# Patient Record
Sex: Male | Born: 1965 | Race: White | Hispanic: No | Marital: Married | Smoking: Never smoker
Health system: Southern US, Community
[De-identification: ages and names within clinical notes are randomized; demographics above are authoritative.]

## PROBLEM LIST (undated history)

## (undated) DIAGNOSIS — G4730 Sleep apnea, unspecified: Secondary | ICD-10-CM

## (undated) DIAGNOSIS — I639 Cerebral infarction, unspecified: Secondary | ICD-10-CM

## (undated) DIAGNOSIS — I10 Essential (primary) hypertension: Secondary | ICD-10-CM

## (undated) DIAGNOSIS — E041 Nontoxic single thyroid nodule: Secondary | ICD-10-CM

## (undated) DIAGNOSIS — H269 Unspecified cataract: Secondary | ICD-10-CM

## (undated) DIAGNOSIS — E119 Type 2 diabetes mellitus without complications: Secondary | ICD-10-CM

## (undated) DIAGNOSIS — D352 Benign neoplasm of pituitary gland: Secondary | ICD-10-CM

## (undated) DIAGNOSIS — N200 Calculus of kidney: Secondary | ICD-10-CM

## (undated) HISTORY — DX: Benign neoplasm of pituitary gland: D35.2

## (undated) HISTORY — PX: OTHER SURGICAL HISTORY: SHX169

## (undated) HISTORY — DX: Essential (primary) hypertension: I10

## (undated) HISTORY — DX: Cerebral infarction, unspecified: I63.9

## (undated) HISTORY — DX: Unspecified cataract: H26.9

## (undated) HISTORY — DX: Sleep apnea, unspecified: G47.30

## (undated) HISTORY — DX: Nontoxic single thyroid nodule: E04.1

## (undated) HISTORY — PX: HERNIA REPAIR: SHX51

## (undated) HISTORY — DX: Calculus of kidney: N20.0

## (undated) HISTORY — DX: Type 2 diabetes mellitus without complications: E11.9

---

## 2017-02-28 ENCOUNTER — Encounter: Payer: Self-pay | Admitting: Gastroenterology

## 2017-04-11 ENCOUNTER — Ambulatory Visit (AMBULATORY_SURGERY_CENTER): Payer: Self-pay | Admitting: *Deleted

## 2017-04-11 VITALS — Ht 68.0 in | Wt 197.4 lb

## 2017-04-11 DIAGNOSIS — Z1211 Encounter for screening for malignant neoplasm of colon: Secondary | ICD-10-CM

## 2017-04-11 MED ORDER — NA SULFATE-K SULFATE-MG SULF 17.5-3.13-1.6 GM/177ML PO SOLN: 1.0000 [IU] | Freq: Once | ORAL | 0 refills | Status: AC

## 2017-04-11 NOTE — Progress Notes (Signed)
No egg or soy allergy known to patient  No issues with past sedation with any surgeries  or procedures, no intubation problems  No diet pills per patient No home 02 use per patient  No blood thinners per patient  Pt denies issues with constipation  No A fib or A flutter  EMMI video sent to pt's e mail  

## 2017-04-18 ENCOUNTER — Encounter: Payer: Self-pay | Admitting: Gastroenterology

## 2017-04-25 ENCOUNTER — Ambulatory Visit (AMBULATORY_SURGERY_CENTER): Payer: BLUE CROSS/BLUE SHIELD | Admitting: Gastroenterology

## 2017-04-25 ENCOUNTER — Encounter: Payer: Self-pay | Admitting: Gastroenterology

## 2017-04-25 VITALS — BP 125/76 | HR 66 | Temp 98.0°F | Resp 18 | Ht 68.0 in | Wt 197.0 lb

## 2017-04-25 DIAGNOSIS — Z1211 Encounter for screening for malignant neoplasm of colon: Secondary | ICD-10-CM

## 2017-04-25 DIAGNOSIS — Z1212 Encounter for screening for malignant neoplasm of rectum: Secondary | ICD-10-CM | POA: Diagnosis not present

## 2017-04-25 MED ORDER — SODIUM CHLORIDE 0.9 % IV SOLN: 500.0000 mL | INTRAVENOUS | Status: DC

## 2017-04-25 NOTE — Progress Notes (Signed)
Pt's states no medical or surgical changes since previsit or office visit. 

## 2017-04-25 NOTE — Patient Instructions (Signed)
YOU HAD AN ENDOSCOPIC PROCEDURE TODAY AT THE [facility]:   Refer to the procedure report that was given to you for any specific questions about what was found during the examination.  If the procedure report does not answer your questions, please call your gastroenterologist to clarify.  If you requested that your care partner not be given the details of your procedure findings, then the procedure report has been included in a sealed envelope for you to review at your convenience later.  YOU SHOULD EXPECT: Some feelings of bloating in the abdomen. Passage of more gas than usual.  Walking can help get rid of the air that was put into your GI tract during the procedure and reduce the bloating. If you had a lower endoscopy (such as a colonoscopy or flexible sigmoidoscopy) you may notice spotting of blood in your stool or on the toilet paper. If you underwent a bowel prep for your procedure, you may not have a normal bowel movement for a few days.  Please Note:  You might notice some irritation and congestion in your nose or some drainage.  This is from the oxygen used during your procedure.  There is no need for concern and it should clear up in a day or so.  SYMPTOMS TO REPORT IMMEDIATELY:   Following lower endoscopy (colonoscopy or flexible sigmoidoscopy):  Excessive amounts of blood in the stool  Significant tenderness or worsening of abdominal pains  Swelling of the abdomen that is new, acute  Fever of 100F or higher   For urgent or emergent issues, a gastroenterologist can be reached at any hour by calling [phone].   DIET:  We do recommend a small meal at first, but then you may proceed to your regular diet.  Drink plenty of fluids but you should avoid alcoholic beverages for 24 hours.  ACTIVITY:  You should plan to take it easy for the rest of today and you should NOT DRIVE or use heavy machinery until tomorrow (because of the sedation medicines used during the test).     FOLLOW UP: Our staff will call the number listed on your records the next business day following your procedure to check on you and address any questions or concerns that you may have regarding the information given to you following your procedure. If we do not reach you, we will leave a message.  However, if you are feeling well and you are not experiencing any problems, there is no need to return our call.  We will assume that you have returned to your regular daily activities without incident.  If any biopsies were taken you will be contacted by phone or by letter within the next 1-3 weeks.  Please call us at [phone] if you have not heard about the biopsies in 3 weeks.    SIGNATURES/CONFIDENTIALITY: You and/or your care partner have signed paperwork which will be entered into your electronic medical record.  These signatures attest to the fact that that the information above on your After Visit Summary has been reviewed and is understood.  Full responsibility of the confidentiality of this discharge information lies with you and/or your care-partner.  Thank you for letting us take care of your healthcare needs today. 

## 2017-04-25 NOTE — Progress Notes (Signed)
Report to PACU, RN, vss, BBS= Clear.  

## 2017-04-25 NOTE — Op Note (Signed)
[place] Patient Name: Jaime Thompson Procedure Date: 04/25/2017 10:05 AM MRN: [id number] Endoscopist: Ladene Artist , MD Age: 51 Referring MD:  Date of Birth: [birth year] Gender: Male Account #: [id number] Procedure:                Colonoscopy Indications:              Screening for colorectal malignant neoplasm Medicines:                Monitored Anesthesia Care Procedure:                Pre-Anesthesia Assessment:                           - Prior to the procedure, a History and Physical                            was performed, and patient medications and                            allergies were reviewed. The patient's tolerance of                            previous anesthesia was also reviewed. The risks                            and benefits of the procedure and the sedation                            options and risks were discussed with the patient.                            All questions were answered, and informed consent                            was obtained. Prior Anticoagulants: The patient has                            taken no previous anticoagulant or antiplatelet                            agents. ASA Grade Assessment: II - A patient with                            mild systemic disease. After reviewing the risks                            and benefits, the patient was deemed in                            satisfactory condition to undergo the procedure.                           After obtaining informed consent, the colonoscope  was passed under direct vision. Throughout the                            procedure, the patient's blood pressure, pulse, and                            oxygen saturations were monitored continuously. The                            Model PCF-[id number]) scope was introduced                            through the anus and advanced to the the cecum,                            identified by  appendiceal orifice and ileocecal                            valve. The ileocecal valve, appendiceal orifice,                            and rectum were photographed. The quality of the                            bowel preparation was excellent. The colonoscopy                            was performed without difficulty. The patient                            tolerated the procedure well. Scope In: 10:19:53 AM Scope Out: 10:31:33 AM Scope Withdrawal Time: 0 hours 10 minutes 40 seconds  Total Procedure Duration: 0 hours 11 minutes 40 seconds  Findings:                 The perianal and digital rectal examinations were                            normal.                           Internal hemorrhoids were found during                            retroflexion. The hemorrhoids were small and Grade                            I (internal hemorrhoids that do not prolapse).                           A few medium-mouthed diverticula were found in the                            left colon.  The exam was otherwise without abnormality on                            direct and retroflexion views. Complications:            No immediate complications. Estimated blood loss:                            None. Estimated Blood Loss:     Estimated blood loss: none. Impression:               - Internal hemorrhoids.                           - Mild diverticulosis in the left colon                           - The examination was otherwise normal on direct                            and retroflexion views.                           - No specimens collected. Recommendation:           - Repeat colonoscopy in 10 years for screening                            purposes.                           - Patient has a contact number available for                            emergencies. The signs and symptoms of potential                            delayed complications were discussed with the                             patient. Return to normal activities tomorrow.                            Written discharge instructions were provided to the                            patient.                           - High fiber diet.                           - Continue present medications. Ladene Artist, MD 04/25/2017 10:38:01 AM This report has been signed electronically.

## 2017-04-26 ENCOUNTER — Telehealth: Payer: Self-pay | Admitting: *Deleted

## 2017-04-26 NOTE — Telephone Encounter (Signed)
  Follow up Call-  Call back number 04/25/2017  Post procedure Call Back phone  # [phone]  Permission to leave phone message Yes     Patient questions:  Do you have a fever, pain , or abdominal swelling? No. Pain Score  0 *  Have you tolerated food without any problems? Yes.    Have you been able to return to your normal activities? Yes.    Do you have any questions about your discharge instructions: Diet   No. Medications  No. Follow up visit  No.  Do you have questions or concerns about your Care? No.  Actions: * If pain score is 4 or above: No action needed, pain <4.

## 2019-04-24 ENCOUNTER — Encounter: Payer: Self-pay | Admitting: Gastroenterology

## 2020-01-31 ENCOUNTER — Encounter (HOSPITAL_COMMUNITY): Payer: Self-pay

## 2020-01-31 ENCOUNTER — Other Ambulatory Visit: Payer: Self-pay

## 2020-01-31 ENCOUNTER — Ambulatory Visit (HOSPITAL_COMMUNITY)
Admission: EM | Admit: 2020-01-31 | Discharge: 2020-01-31 | Disposition: A | Discharge status: Home / Self Care | Payer: BC Managed Care – PPO | Attending: Family Medicine | Admitting: Family Medicine

## 2020-01-31 DIAGNOSIS — M25511 Pain in right shoulder: Secondary | ICD-10-CM

## 2020-01-31 DIAGNOSIS — T148XXA Other injury of unspecified body region, initial encounter: Secondary | ICD-10-CM

## 2020-01-31 MED ORDER — CYCLOBENZAPRINE HCL 10 MG PO TABS: 10.0000 mg | ORAL_TABLET | Freq: Two times a day (BID) | ORAL | 0 refills | Status: DC | PRN

## 2020-01-31 NOTE — Discharge Instructions (Addendum)
Take the ibuprofen/aleve or tylenol as needed.  Rest your arm.  Apply ice packs 2-3 times a day for up to 20 minutes each.    Use the flexeril as needed at night for sleep and spasms  Follow up with ortho or sports medicine if symptoms are persisting  Follow up with your primary care provider or an orthopedist if you symptoms continue or worsen;  Or if you develop new symptoms, such as numbness, tingling, or weakness.

## 2020-01-31 NOTE — ED Triage Notes (Signed)
C/o right shoulder pain that started two days ago.

## 2020-02-03 NOTE — ED Provider Notes (Signed)
[place]   [id number] 01/31/20 Arrival Time: 1548  [id number] PAIN  SUBJECTIVE: History from: patient. Jaime Thompson is a 54 y.o. male complains of right shoulder pain that began 2 days ago. Denies a precipitating event or specific injury.  Localizes the pain to the R deltoid. Describes the pain as intermittent and achy in character. Has tried OTC medications without relief. Symptoms are made worse with activity. Denies similar symptoms in the past. Denies fever, chills, erythema, ecchymosis, effusion, weakness, numbness and tingling, saddle paresthesias, loss of bowel or bladder function.      ROS: As per HPI.  All other pertinent ROS negative.     Past Medical History:  Diagnosis Date  . Cataract    no surgery as yet/pre diagnosed  . Diabetes mellitus without complication ([place])   . Hypertension   . Sleep apnea    Past Surgical History:  Procedure Laterality Date  . bilateral inguinal hernia repair     No Known Allergies Current Facility-Administered Medications on File Prior to Encounter  Medication Dose Route Frequency Provider Last Rate Last Admin  . 0.9 %  sodium chloride infusion  500 mL Intravenous Continuous Ladene Artist, MD       Current Outpatient Medications on File Prior to Encounter  Medication Sig Dispense Refill  . atenolol (TENORMIN) 25 MG tablet TAKE THREE TABLETS BY MOUTH DAILY    . diltiazem (CARTIA XT) 120 MG 24 hr capsule Take 120 mg by mouth daily.    . naproxen sodium (ANAPROX) 220 MG tablet as needed    . telmisartan (MICARDIS) 40 MG tablet     . Testosterone 20.25 MG/ACT (1.62%) GEL      Social History   Socioeconomic History  . Marital status: Married    Spouse name: Not on file  . Number of children: Not on file  . Years of education: Not on file  . Highest education level: Not on file  Occupational History  . Not on file  Tobacco Use  . Smoking status: Never Smoker  . Smokeless tobacco: Never Used  Substance and Sexual  Activity  . Alcohol use: No  . Drug use: No  . Sexual activity: Not on file  Other Topics Concern  . Not on file  Social History Narrative  . Not on file   Social Determinants of Health   Financial Resource Strain:   . Difficulty of Paying Living Expenses:   Food Insecurity:   . Worried About Charity fundraiser in the Last Year:   . Arboriculturist in the Last Year:   Transportation Needs:   . Film/video editor (Medical):   Marland Kitchen Lack of Transportation (Non-Medical):   Physical Activity:   . Days of Exercise per Week:   . Minutes of Exercise per Session:   Stress:   . Feeling of Stress :   Social Connections:   . Frequency of Communication with Friends and Family:   . Frequency of Social Gatherings with Friends and Family:   . Attends Religious Services:   . Active Member of Clubs or Organizations:   . Attends Archivist Meetings:   Marland Kitchen Marital Status:   Intimate Partner Violence:   . Fear of Current or Ex-Partner:   . Emotionally Abused:   Marland Kitchen Physically Abused:   . Sexually Abused:    Family History  Problem Relation Age of Onset  . Colon cancer Neg Hx   . Colon polyps Neg Hx   .  Esophageal cancer Neg Hx   . Rectal cancer Neg Hx   . Stomach cancer Neg Hx     OBJECTIVE:  Vitals:   01/31/20 1620  BP: (!) 153/91  Pulse: 62  Resp: 14  Temp: 98.1 F (36.7 C)  TempSrc: Oral  SpO2: 99%    General appearance: ALERT; in no acute distress.  Head: NCAT Lungs: Normal respiratory effort CV: radial pulses 2+ bilaterally. Cap refill < 2 seconds Musculoskeletal:  Inspection: Skin warm, dry, clear and intact without obvious erythema, effusion, or ecchymosis.  Palpation: Nontender to palpation ROM: FROM active and passive Strength: 5/5 shld abduction, 5/5 shld adduction, 5/5 elbow flexion, 5/5 elbow extension, 5/5 grip strength,Skin: warm and dry Neurologic: Ambulates without difficulty; Sensation intact about the upper/ lower extremities Psychological:  alert and cooperative; normal mood and affect  DIAGNOSTIC STUDIES:  No results found.   ASSESSMENT & PLAN:  1. Acute pain of right shoulder   2. Muscle strain       Meds ordered this encounter  Medications  . cyclobenzaprine (FLEXERIL) 10 MG tablet    Sig: Take 1 tablet (10 mg total) by mouth 2 (two) times daily as needed for muscle spasms.    Dispense:  20 tablet    Refill:  0    Order Specific Question:   Supervising Provider    Answer:   Chase Picket [id number]   R shoulder pain Muscle strain Continue conservative management of rest, ice, and gentle stretches Take naproxen as needed for pain relief (may cause abdominal discomfort, ulcers, and GI bleeds avoid taking with other NSAIDs) Take cyclobenzaprine at nighttime for symptomatic relief. Avoid driving or operating heavy machinery while using medication. Follow up with PCP if symptoms persist Return or go to the ER if you have any new or worsening symptoms (fever, chills, chest pain, abdominal pain, changes in bowel or bladder habits, pain radiating into lower legs)   Reviewed expectations re: course of current medical issues. Questions answered. Outlined signs and symptoms indicating need for more acute intervention. Patient verbalized understanding. After Visit Summary given.       Faustino Congress, NP 02/03/20 1319

## 2020-02-07 ENCOUNTER — Encounter (HOSPITAL_COMMUNITY): Payer: Self-pay

## 2020-02-07 ENCOUNTER — Emergency Department (HOSPITAL_COMMUNITY): Payer: BC Managed Care – PPO

## 2020-02-07 ENCOUNTER — Observation Stay (HOSPITAL_COMMUNITY)
Admission: EM | Admit: 2020-02-07 | Discharge: 2020-02-08 | DRG: 063 | Disposition: A | Discharge status: Home / Self Care | Payer: BC Managed Care – PPO | Attending: Neurology | Admitting: Neurology

## 2020-02-07 ENCOUNTER — Other Ambulatory Visit: Payer: Self-pay

## 2020-02-07 DIAGNOSIS — G510 Bell's palsy: Secondary | ICD-10-CM | POA: Diagnosis present

## 2020-02-07 DIAGNOSIS — E669 Obesity, unspecified: Secondary | ICD-10-CM | POA: Diagnosis not present

## 2020-02-07 DIAGNOSIS — I6300 Cerebral infarction due to thrombosis of unspecified precerebral artery: Secondary | ICD-10-CM | POA: Diagnosis not present

## 2020-02-07 DIAGNOSIS — R531 Weakness: Secondary | ICD-10-CM | POA: Diagnosis present

## 2020-02-07 DIAGNOSIS — Z6830 Body mass index (BMI) 30.0-30.9, adult: Secondary | ICD-10-CM

## 2020-02-07 DIAGNOSIS — R402142 Coma scale, eyes open, spontaneous, at arrival to emergency department: Secondary | ICD-10-CM | POA: Diagnosis present

## 2020-02-07 DIAGNOSIS — R471 Dysarthria and anarthria: Secondary | ICD-10-CM | POA: Diagnosis present

## 2020-02-07 DIAGNOSIS — G4733 Obstructive sleep apnea (adult) (pediatric): Secondary | ICD-10-CM | POA: Diagnosis present

## 2020-02-07 DIAGNOSIS — H269 Unspecified cataract: Secondary | ICD-10-CM | POA: Diagnosis present

## 2020-02-07 DIAGNOSIS — I10 Essential (primary) hypertension: Secondary | ICD-10-CM | POA: Diagnosis not present

## 2020-02-07 DIAGNOSIS — R2990 Unspecified symptoms and signs involving the nervous system: Secondary | ICD-10-CM | POA: Diagnosis present

## 2020-02-07 DIAGNOSIS — Z79899 Other long term (current) drug therapy: Secondary | ICD-10-CM | POA: Diagnosis not present

## 2020-02-07 DIAGNOSIS — Z7952 Long term (current) use of systemic steroids: Secondary | ICD-10-CM

## 2020-02-07 DIAGNOSIS — M25511 Pain in right shoulder: Secondary | ICD-10-CM | POA: Diagnosis present

## 2020-02-07 DIAGNOSIS — Z7984 Long term (current) use of oral hypoglycemic drugs: Secondary | ICD-10-CM

## 2020-02-07 DIAGNOSIS — R29704 NIHSS score 4: Secondary | ICD-10-CM | POA: Diagnosis present

## 2020-02-07 DIAGNOSIS — R402362 Coma scale, best motor response, obeys commands, at arrival to emergency department: Secondary | ICD-10-CM | POA: Diagnosis present

## 2020-02-07 DIAGNOSIS — E041 Nontoxic single thyroid nodule: Secondary | ICD-10-CM

## 2020-02-07 DIAGNOSIS — E785 Hyperlipidemia, unspecified: Secondary | ICD-10-CM | POA: Diagnosis present

## 2020-02-07 DIAGNOSIS — R402252 Coma scale, best verbal response, oriented, at arrival to emergency department: Secondary | ICD-10-CM | POA: Diagnosis present

## 2020-02-07 DIAGNOSIS — I639 Cerebral infarction, unspecified: Secondary | ICD-10-CM | POA: Diagnosis present

## 2020-02-07 DIAGNOSIS — E1136 Type 2 diabetes mellitus with diabetic cataract: Secondary | ICD-10-CM | POA: Diagnosis present

## 2020-02-07 DIAGNOSIS — Z20822 Contact with and (suspected) exposure to covid-19: Secondary | ICD-10-CM | POA: Diagnosis present

## 2020-02-07 DIAGNOSIS — E042 Nontoxic multinodular goiter: Secondary | ICD-10-CM | POA: Diagnosis present

## 2020-02-07 DIAGNOSIS — E119 Type 2 diabetes mellitus without complications: Secondary | ICD-10-CM | POA: Diagnosis not present

## 2020-02-07 LAB — URINALYSIS, ROUTINE W REFLEX MICROSCOPIC
Bacteria, UA: NONE SEEN
Bilirubin Urine: NEGATIVE
Glucose, UA: >=500 mg/dL — AB
Hgb urine dipstick: NEGATIVE
Ketones, ur: NEGATIVE mg/dL
Leukocytes,Ua: NEGATIVE
Nitrite: NEGATIVE
Protein, ur: NEGATIVE mg/dL
Specific Gravity, Urine: 1.024 (ref 1.005–1.030)
pH: 5 (ref 5.0–8.0)

## 2020-02-07 LAB — RAPID URINE DRUG SCREEN, HOSP PERFORMED
Amphetamines: NOT DETECTED
Amphetamines: NOT DETECTED
Barbiturates: NOT DETECTED
Barbiturates: NOT DETECTED
Benzodiazepines: NOT DETECTED
Benzodiazepines: NOT DETECTED
Cocaine: NOT DETECTED
Cocaine: NOT DETECTED
Opiates: NOT DETECTED
Opiates: NOT DETECTED
Tetrahydrocannabinol: NOT DETECTED
Tetrahydrocannabinol: NOT DETECTED

## 2020-02-07 LAB — DIFFERENTIAL
Abs Immature Granulocytes: 0.04 10*3/uL (ref 0.00–0.07)
Basophils Absolute: 0 10*3/uL (ref 0.0–0.1)
Basophils Relative: 1 %
Eosinophils Absolute: 0.2 10*3/uL (ref 0.0–0.5)
Eosinophils Relative: 3 %
Immature Granulocytes: 1 %
Lymphocytes Relative: 28 %
Lymphs Abs: 2.2 10*3/uL (ref 0.7–4.0)
Monocytes Absolute: 0.6 10*3/uL (ref 0.1–1.0)
Monocytes Relative: 7 %
Neutro Abs: 4.8 10*3/uL (ref 1.7–7.7)
Neutrophils Relative %: 60 %

## 2020-02-07 LAB — COMPREHENSIVE METABOLIC PANEL
ALT: 40 U/L (ref 0–44)
AST: 24 U/L (ref 15–41)
Albumin: 4.3 g/dL (ref 3.5–5.0)
Alkaline Phosphatase: 64 U/L (ref 38–126)
Anion gap: 11 (ref 5–15)
BUN: 15 mg/dL (ref 6–20)
CO2: 24 mmol/L (ref 22–32)
Calcium: 8.9 mg/dL (ref 8.9–10.3)
Chloride: 105 mmol/L (ref 98–111)
Creatinine, Ser: 0.97 mg/dL (ref 0.61–1.24)
GFR calc Af Amer: >60 mL/min (ref >60)
GFR calc non Af Amer: >60 mL/min (ref >60)
Glucose, Bld: 141 mg/dL — ABNORMAL HIGH (ref 70–99)
Potassium: 3.5 mmol/L (ref 3.5–5.1)
Sodium: 140 mmol/L (ref 135–145)
Total Bilirubin: 0.6 mg/dL (ref 0.3–1.2)
Total Protein: 7.4 g/dL (ref 6.5–8.1)

## 2020-02-07 LAB — CBC
HCT: 42.8 % (ref 39.0–52.0)
HCT: 41.6 % (ref 39.0–52.0)
Hemoglobin: 13.8 g/dL (ref 13.0–17.0)
Hemoglobin: 13.4 g/dL (ref 13.0–17.0)
MCH: 29.4 pg (ref 26.0–34.0)
MCH: 29.1 pg (ref 26.0–34.0)
MCHC: 32.2 g/dL (ref 30.0–36.0)
MCHC: 32.2 g/dL (ref 30.0–36.0)
MCV: 91.1 fL (ref 80.0–100.0)
MCV: 90.4 fL (ref 80.0–100.0)
Platelets: 278 10*3/uL (ref 150–400)
Platelets: 264 10*3/uL (ref 150–400)
RBC: 4.7 MIL/uL (ref 4.22–5.81)
RBC: 4.6 MIL/uL (ref 4.22–5.81)
RDW: 13.6 % (ref 11.5–15.5)
RDW: 13.5 % (ref 11.5–15.5)
WBC: 7.9 10*3/uL (ref 4.0–10.5)
WBC: 8.6 10*3/uL (ref 4.0–10.5)
nRBC: 0 % (ref 0.0–0.2)
nRBC: 0 % (ref 0.0–0.2)

## 2020-02-07 LAB — URINALYSIS, COMPLETE (UACMP) WITH MICROSCOPIC
Bacteria, UA: NONE SEEN
Bilirubin Urine: NEGATIVE
Glucose, UA: 50 mg/dL — AB
Hgb urine dipstick: NEGATIVE
Ketones, ur: NEGATIVE mg/dL
Leukocytes,Ua: NEGATIVE
Nitrite: NEGATIVE
Protein, ur: NEGATIVE mg/dL
Specific Gravity, Urine: >1.046 — ABNORMAL HIGH (ref 1.005–1.030)
pH: 6 (ref 5.0–8.0)

## 2020-02-07 LAB — CBG MONITORING, ED: Glucose-Capillary: 133 mg/dL — ABNORMAL HIGH (ref 70–99)

## 2020-02-07 LAB — MRSA PCR SCREENING: MRSA by PCR: NEGATIVE

## 2020-02-07 LAB — APTT
aPTT: 28 seconds (ref 24–36)
aPTT: 29 seconds (ref 24–36)

## 2020-02-07 LAB — ETHANOL: Alcohol, Ethyl (B): <10 mg/dL (ref <10)

## 2020-02-07 LAB — PROTIME-INR
INR: 1 (ref 0.8–1.2)
INR: 1.1 (ref 0.8–1.2)
Prothrombin Time: 12.8 seconds (ref 11.4–15.2)
Prothrombin Time: 13.3 seconds (ref 11.4–15.2)

## 2020-02-07 LAB — HIV ANTIBODY (ROUTINE TESTING W REFLEX): HIV Screen 4th Generation wRfx: NONREACTIVE

## 2020-02-07 LAB — SARS CORONAVIRUS 2 BY RT PCR (HOSPITAL ORDER, PERFORMED IN ~~LOC~~ HOSPITAL LAB): SARS Coronavirus 2: NEGATIVE

## 2020-02-07 LAB — GLUCOSE, CAPILLARY: Glucose-Capillary: 135 mg/dL — ABNORMAL HIGH (ref 70–99)

## 2020-02-07 IMAGING — CT CT HEAD CODE STROKE
3 series · 16 of 47 positions shown, 19 images · non-contrast
Comparison: None.

CLINICAL DATA: Code stroke.  Right facial droop and slurred speech.

EXAM:
CT HEAD WITHOUT CONTRAST
TECHNIQUE: Contiguous axial images were obtained from the base of the skull
through the vertex without intravenous contrast.

[Series 2: head w o · axial · 0.47mm/px · z∈[+1476,+1616]mm · 10 of 34 slices shown, 13 images]
[im 3/34  brain]
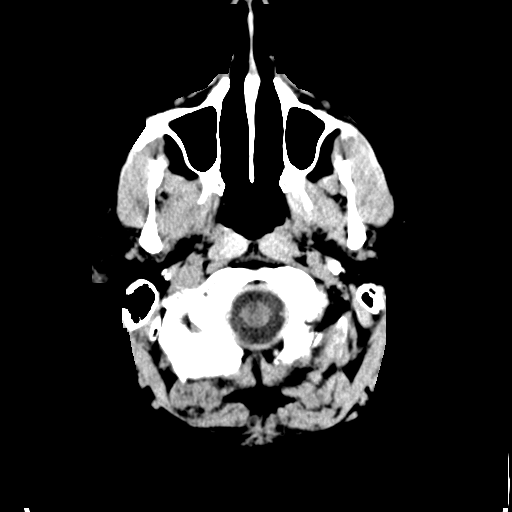
[im 3/34  bone]
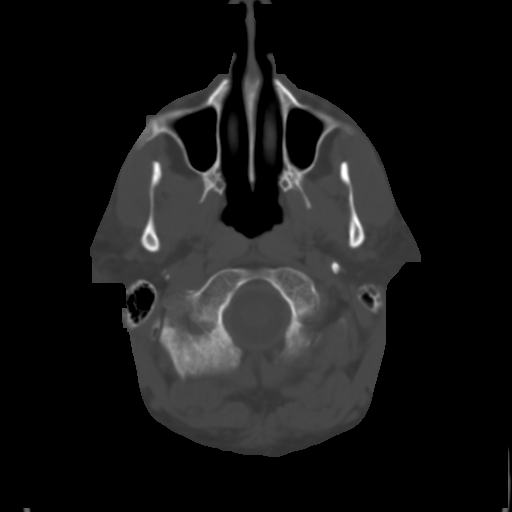
[im 6/34  brain]
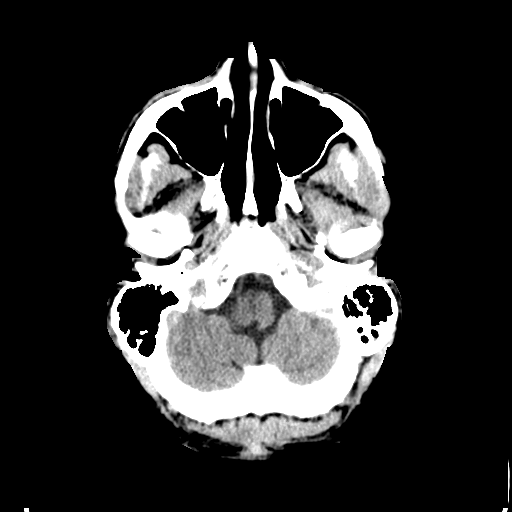
[im 10/34  brain]
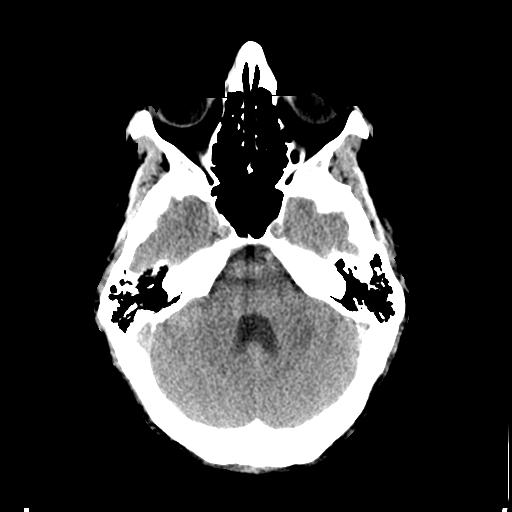
[im 12/34  brain]
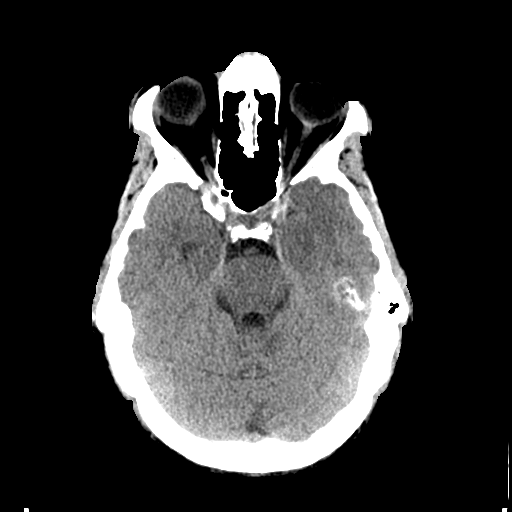
[im 15/34  brain]
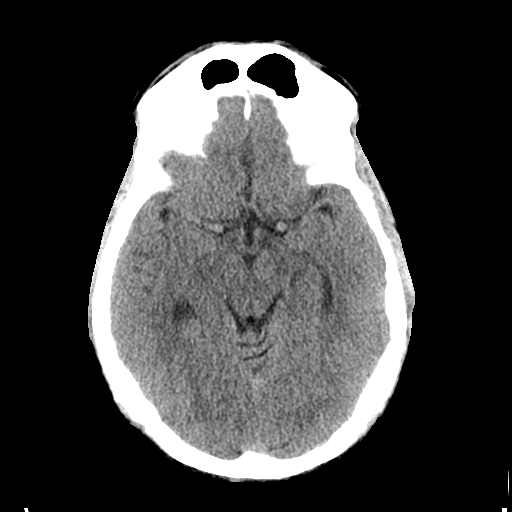
[im 15/34  bone]
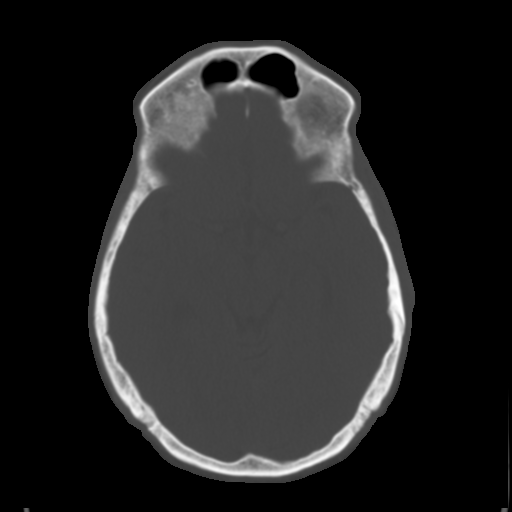
[im 19/34  brain]
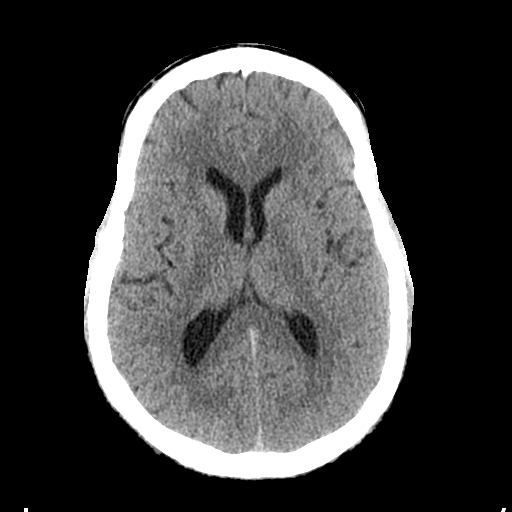
[im 22/34  brain]
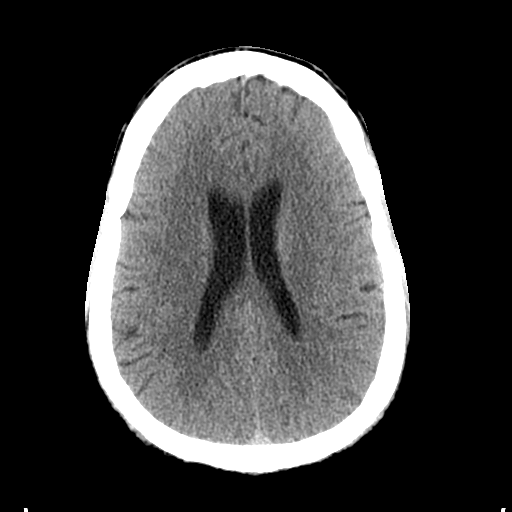
[im 26/34  brain]
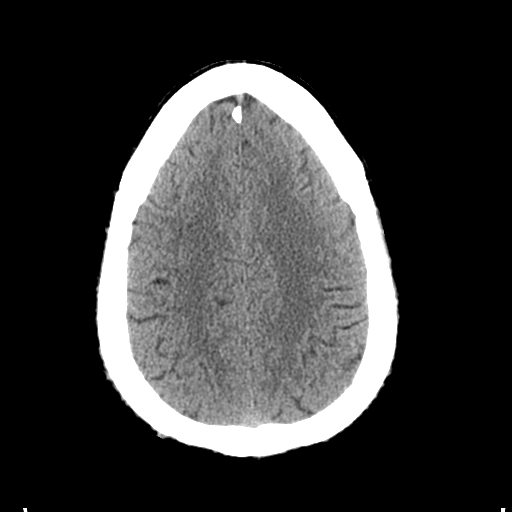
[im 28/34  brain]
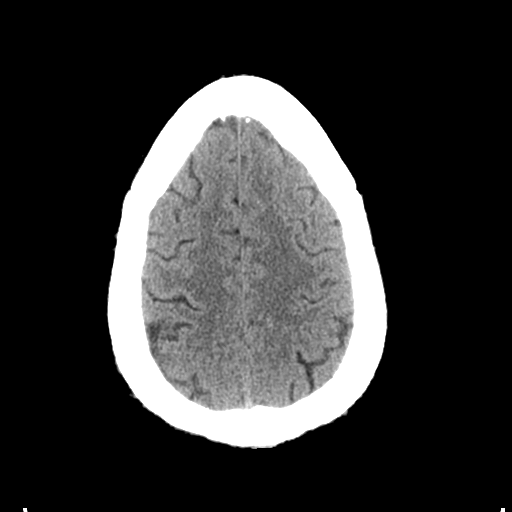
[im 28/34  bone]
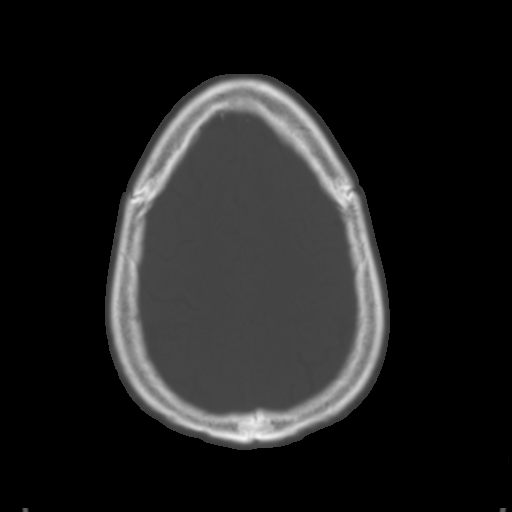
[im 31/34  brain]
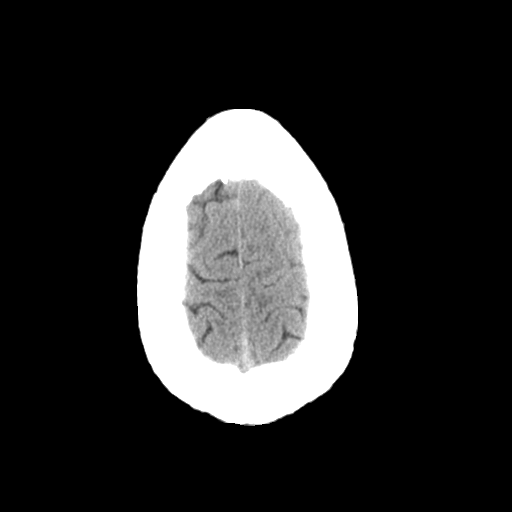

[Series 4: coronal soft · coronal · 0.37mm/px · 3 of 74 slices shown]
[im 25/74  brain]
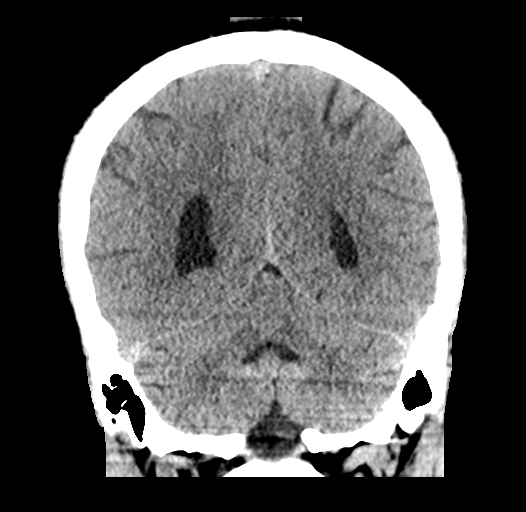
[im 33/74  brain]
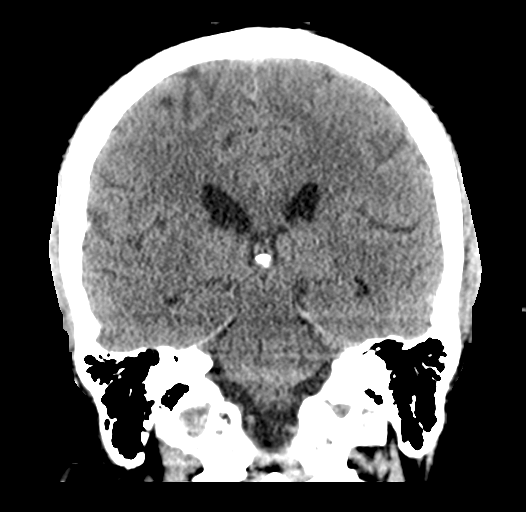
[im 41/74  brain]
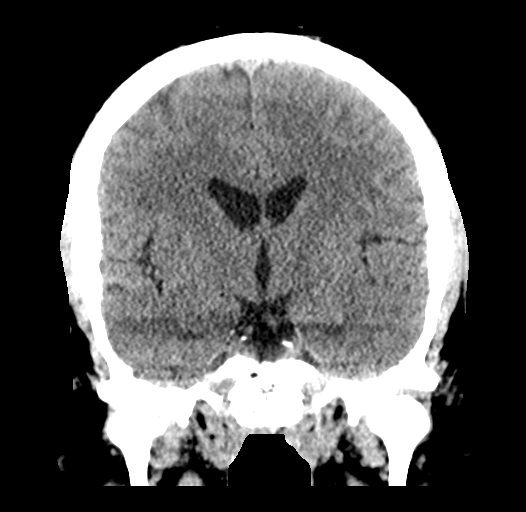

[Series 5: sagittal soft · sagittal · 0.37mm/px · 3 of 61 slices shown]
[im 21/61  brain]
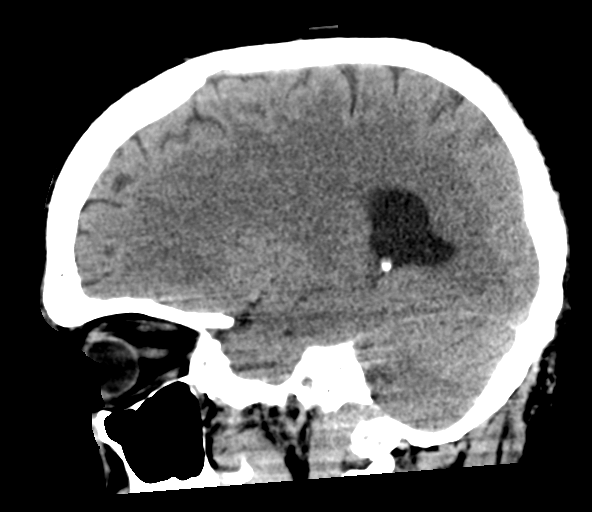
[im 31/61  brain]
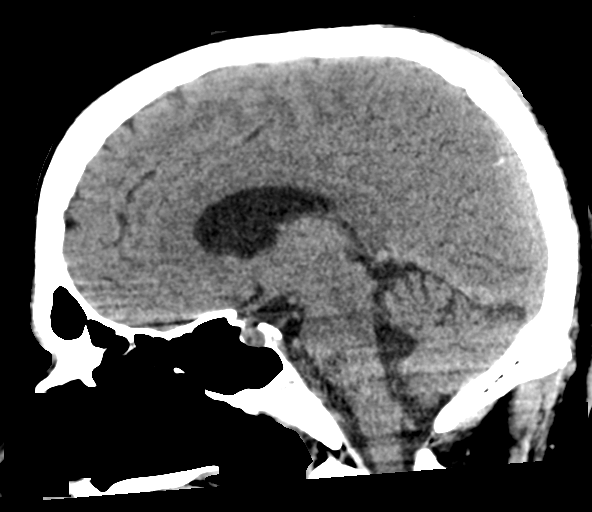
[im 41/61  brain]
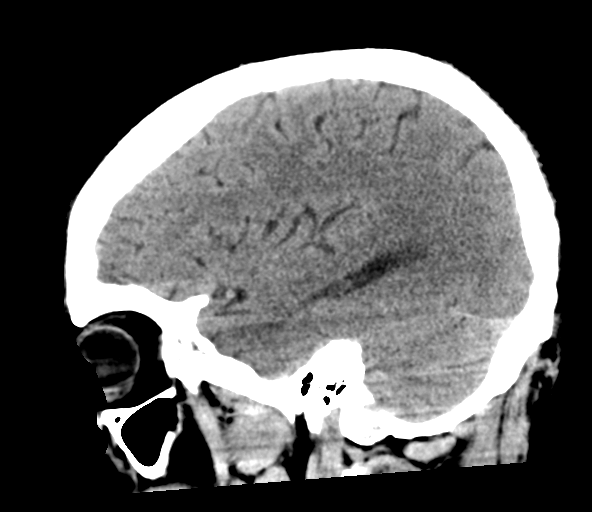

[16 of 47 positions shown; findings below may reference images not displayed]

FINDINGS: Brain: Normal appearance without evidence of old or acute
infarction, mass lesion, hemorrhage, hydrocephalus or extra-axial
collection.

Vascular: No abnormal vascular finding.

Skull: Normal

Sinuses/Orbits: Clear/normal

Other: None

ASPECTS (Alberta Stroke Program Early CT Score)

- Ganglionic level infarction (caudate, lentiform nuclei, internal
capsule, insula, M1-M3 cortex): 7

- Supraganglionic infarction (M4-M6 cortex): 3

Total score (0-10 with 10 being normal): 10
IMPRESSION: 1. Normal head CT
2. ASPECTS is 10
3. These results were called by telephone at the time of
interpretation on [DATE] at [DATE] to provider EVANGELISTA REYNALDO , who
verbally acknowledged these results.

## 2020-02-07 MED ORDER — SODIUM CHLORIDE 0.9 % IV SOLN: INTRAVENOUS | Status: AC

## 2020-02-07 MED ORDER — IOHEXOL 350 MG/ML SOLN
75.0000 mL | Freq: Once | INTRAVENOUS | Status: AC | PRN
  Administered 2020-02-07: 75 mL via INTRAVENOUS

## 2020-02-07 MED ORDER — INSULIN ASPART 100 UNIT/ML ~~LOC~~ SOLN
0.0000 [IU] | Freq: Three times a day (TID) | SUBCUTANEOUS | Status: DC
  Administered 2020-02-08 (×2): 2 [IU] via SUBCUTANEOUS

## 2020-02-07 MED ORDER — CYCLOBENZAPRINE HCL 10 MG PO TABS: 10.0000 mg | ORAL_TABLET | Freq: Two times a day (BID) | ORAL | Status: DC | PRN

## 2020-02-07 MED ORDER — PANTOPRAZOLE SODIUM 40 MG IV SOLR: 40.0000 mg | Freq: Every day | INTRAVENOUS | Status: DC

## 2020-02-07 MED ORDER — ACETAMINOPHEN 160 MG/5ML PO SOLN: 650.0000 mg | ORAL | Status: DC | PRN

## 2020-02-07 MED ORDER — CHLORHEXIDINE GLUCONATE CLOTH 2 % EX PADS: 6.0000 | MEDICATED_PAD | Freq: Every day | CUTANEOUS | Status: DC

## 2020-02-07 MED ORDER — CLEVIDIPINE BUTYRATE 0.5 MG/ML IV EMUL: 0.0000 mg/h | INTRAVENOUS | Status: DC

## 2020-02-07 MED ORDER — ALTEPLASE (STROKE) FULL DOSE INFUSION: 0.9000 mg/kg | Freq: Once | INTRAVENOUS | Status: AC

## 2020-02-07 MED ORDER — PANTOPRAZOLE SODIUM 40 MG PO TBEC
40.0000 mg | DELAYED_RELEASE_TABLET | Freq: Every day | ORAL | Status: DC
  Administered 2020-02-07: 40 mg via ORAL
  Filled 2020-02-07: qty 1

## 2020-02-07 MED ORDER — INSULIN ASPART 100 UNIT/ML ~~LOC~~ SOLN
4.0000 [IU] | Freq: Three times a day (TID) | SUBCUTANEOUS | Status: DC
  Administered 2020-02-08 (×2): 4 [IU] via SUBCUTANEOUS

## 2020-02-07 MED ORDER — SENNOSIDES-DOCUSATE SODIUM 8.6-50 MG PO TABS: 1.0000 | ORAL_TABLET | Freq: Every evening | ORAL | Status: DC | PRN

## 2020-02-07 MED ORDER — ATENOLOL 50 MG PO TABS
75.0000 mg | ORAL_TABLET | Freq: Every day | ORAL | Status: DC
  Administered 2020-02-08: 75 mg via ORAL
  Filled 2020-02-07: qty 2

## 2020-02-07 MED ORDER — HYDRALAZINE HCL 20 MG/ML IJ SOLN: 10.0000 mg | INTRAMUSCULAR | Status: DC | PRN

## 2020-02-07 MED ORDER — DILTIAZEM HCL ER COATED BEADS 120 MG PO CP24
120.0000 mg | ORAL_CAPSULE | Freq: Every day | ORAL | Status: DC
  Administered 2020-02-08: 120 mg via ORAL
  Filled 2020-02-07 (×2): qty 1

## 2020-02-07 MED ORDER — TESTOSTERONE 50 MG/5GM (1%) TD GEL
2.5000 g | Freq: Every day | TRANSDERMAL | Status: DC
  Administered 2020-02-08: 2.5 g via TRANSDERMAL
  Filled 2020-02-07: qty 5

## 2020-02-07 MED ORDER — ACETAMINOPHEN 325 MG PO TABS: 650.0000 mg | ORAL_TABLET | ORAL | Status: DC | PRN

## 2020-02-07 MED ORDER — LABETALOL HCL 5 MG/ML IV SOLN: 10.0000 mg | INTRAVENOUS | Status: DC | PRN

## 2020-02-07 MED ORDER — STROKE: EARLY STAGES OF RECOVERY BOOK: Freq: Once | Status: DC

## 2020-02-07 MED ORDER — ACETAMINOPHEN 650 MG RE SUPP: 650.0000 mg | RECTAL | Status: DC | PRN

## 2020-02-07 MED ORDER — IRBESARTAN 150 MG PO TABS
75.0000 mg | ORAL_TABLET | Freq: Every day | ORAL | Status: DC
  Administered 2020-02-08: 75 mg via ORAL
  Filled 2020-02-07 (×2): qty 1

## 2020-02-07 MED ORDER — ALTEPLASE 100 MG IV SOLR
INTRAVENOUS | Status: AC
  Administered 2020-02-07: 82.8 mg via INTRAVENOUS
  Filled 2020-02-07: qty 100

## 2020-02-07 MED ORDER — SODIUM CHLORIDE 0.9 % IV SOLN
50.0000 mL | Freq: Once | INTRAVENOUS | Status: AC
  Administered 2020-02-07: 50 mL via INTRAVENOUS

## 2020-02-07 NOTE — ED Notes (Signed)
To CT for CT angio

## 2020-02-07 NOTE — ED Provider Notes (Signed)
[facility] EMERGENCY DEPARTMENT Provider Note   CSN: [id number] Arrival date & time: 02/07/20  1112     History Chief Complaint  Patient presents with  . facial droop    Jaime Thompson is a 54 y.o. male.  Patient was evaluated at [facility] ER for right-sided facial weakness and right leg symptoms.  Concern clinically for Bell's palsy versus stroke.  Patient within time window and neuro evaluated on teleneuro recommended TPA.  tPA was given and patient was transferred to [facility] emergency department to be monitored and seen neurology here.  Patient feels similar signs and symptoms on arrival.        Past Medical History:  Diagnosis Date  . Cataract    no surgery as yet/pre diagnosed  . Diabetes mellitus without complication ([place])   . Hypertension   . Sleep apnea     Patient Active Problem List   Diagnosis Date Noted  . Essential hypertension 02/08/2020  . Hyperlipidemia 02/08/2020  . Diabetes mellitus, type II ([place]) 02/08/2020  . Thyroid nodule 02/08/2020  . Bell's palsy 02/07/2020  . Stroke-like symptoms s/p tPA 02/07/2020    Past Surgical History:  Procedure Laterality Date  . bilateral inguinal hernia repair    . HERNIA REPAIR         Family History  Problem Relation Age of Onset  . Colon cancer Neg Hx   . Colon polyps Neg Hx   . Esophageal cancer Neg Hx   . Rectal cancer Neg Hx   . Stomach cancer Neg Hx     Social History   Tobacco Use  . Smoking status: Never Smoker  . Smokeless tobacco: Never Used  Substance Use Topics  . Alcohol use: No  . Drug use: No    Home Medications Prior to Admission medications   Medication Sig Start Date End Date Taking? Authorizing Provider  acetaminophen (TYLENOL) 500 MG tablet Take 1,000 mg by mouth every 6 (six) hours as needed for headache (pain).   Yes [provider]  atenolol (TENORMIN) 25 MG tablet Take 75 mg by mouth daily.  12/25/12  Yes [provider]   atorvastatin (LIPITOR) 20 MG tablet Take 20 mg by mouth daily. 01/02/20  Yes [provider]  cyclobenzaprine (FLEXERIL) 10 MG tablet Take 1 tablet (10 mg total) by mouth 2 (two) times daily as needed for muscle spasms. 01/31/20  Yes Faustino Congress, NP  diltiazem (CARTIA XT) 120 MG 24 hr capsule Take 120 mg by mouth daily.   Yes [provider]  escitalopram (LEXAPRO) 10 MG tablet Take 10 mg by mouth daily. 01/02/20  Yes [provider]  glipiZIDE (GLUCOTROL XL) 5 MG 24 hr tablet Take 5 mg by mouth daily. 12/05/19  Yes [provider]  montelukast (SINGULAIR) 10 MG tablet Take 10 mg by mouth daily as needed (seasonal allergies).  11/10/19  Yes [provider]  ondansetron (ZOFRAN-ODT) 8 MG disintegrating tablet Take 8 mg by mouth every 8 (eight) hours as needed for nausea or vomiting.  01/19/20  Yes [provider]  sildenafil (REVATIO) 20 MG tablet Take 40 mg by mouth daily as needed (erectile dysfunction).  09/11/19  Yes [provider]  telmisartan (MICARDIS) 40 MG tablet Take 40 mg by mouth daily.  11/10/10  Yes [provider]  Testosterone 20.25 MG/ACT (1.62%) GEL Apply 20.25 mg topically See admin instructions. Apply 1 pump (20.25 mg) topically to each shoulder once daily 09/09/10  Yes [provider]  artificial tears (LACRILUBE) OINT ophthalmic ointment Place into the right eye at bedtime. 02/08/20   Donzetta Starch, NP  metFORMIN (GLUCOPHAGE) 1000 MG tablet Take 1 tablet (1,000 mg total) by mouth 2 (two) times daily. 02/10/20   Donzetta Starch, NP  polyvinyl alcohol (LIQUIFILM TEARS) 1.4 % ophthalmic solution Place 1 drop into the right eye as needed for dry eyes. 02/08/20   Donzetta Starch, NP  predniSONE (DELTASONE) 20 MG tablet Take 3 tablets (60 mg total) by mouth daily with breakfast. 02/08/20   Donzetta Starch, NP  valACYclovir (VALTREX) 1000 MG tablet Take 1 tablet (1,000 mg total) by mouth 3 (three) times daily. 02/08/20    Donzetta Starch, NP    Allergies    Patient has no known allergies.  Review of Systems   Review of Systems  Constitutional: Negative for chills and fever.  HENT: Negative for congestion.   Eyes: Negative for visual disturbance.  Respiratory: Negative for shortness of breath.   Cardiovascular: Negative for chest pain.  Gastrointestinal: Negative for abdominal pain and vomiting.  Genitourinary: Negative for dysuria and flank pain.  Musculoskeletal: Negative for back pain, neck pain and neck stiffness.  Skin: Negative for rash.  Neurological: Positive for facial asymmetry, weakness and numbness. Negative for speech difficulty, light-headedness and headaches.    Physical Exam Updated Vital Signs BP (!) 141/79   Pulse (!) 54   Temp 98.5 F (36.9 C) (Oral)   Resp 20   Ht 5\' 8"  (1.727 m)   Wt 90.4 kg   SpO2 96%   BMI 30.30 kg/m   Physical Exam Vitals and nursing note reviewed.  Constitutional:      Appearance: He is well-developed.  HENT:     Head: Normocephalic and atraumatic.  Eyes:     General:        Right eye: No discharge.        Left eye: No discharge.     Conjunctiva/sclera: Conjunctivae normal.  Neck:     Trachea: No tracheal deviation.  Cardiovascular:     Rate and Rhythm: Normal rate.  Pulmonary:     Effort: Pulmonary effort is normal.  Abdominal:     General: There is no distension.     Palpations: Abdomen is soft.     Tenderness: There is no abdominal tenderness. There is no guarding.  Musculoskeletal:     Cervical back: Normal range of motion and neck supple.  Skin:    General: Skin is warm.     Findings: No rash.  Neurological:     Mental Status: He is alert and oriented to person, place, and time.     GCS: GCS eye subscore is 4. GCS verbal subscore is 5. GCS motor subscore is 6.     Comments: Patient has right facial droop and decreased strength with closing right eye and decreased strength with raising right eyebrow.  Patient has normal sensation  upper and lower extremities bilateral, no arm or leg drift upper and lower extremities.     ED Results / Procedures / Treatments   Labs (all labs ordered are listed, but only abnormal results are displayed) Labs Reviewed  COMPREHENSIVE METABOLIC PANEL - Abnormal; Notable for the following components:      Result Value   Glucose, Bld 141 (*)    All other components within normal limits  URINALYSIS, ROUTINE W REFLEX MICROSCOPIC - Abnormal; Notable for the following components:   APPearance HAZY (*)  Glucose, UA >=500 (*)    All other components within normal limits  URINALYSIS, COMPLETE (UACMP) WITH MICROSCOPIC - Abnormal; Notable for the following components:   Specific Gravity, Urine >1.046 (*)    Glucose, UA 50 (*)    All other components within normal limits  HEMOGLOBIN A1C - Abnormal; Notable for the following components:   Hgb A1c MFr Bld 7.2 (*)    All other components within normal limits  LIPID PANEL - Abnormal; Notable for the following components:   HDL 37 (*)    All other components within normal limits  GLUCOSE, CAPILLARY - Abnormal; Notable for the following components:   Glucose-Capillary 135 (*)    All other components within normal limits  GLUCOSE, CAPILLARY - Abnormal; Notable for the following components:   Glucose-Capillary 127 (*)    All other components within normal limits  GLUCOSE, CAPILLARY - Abnormal; Notable for the following components:   Glucose-Capillary 133 (*)    All other components within normal limits  CBG MONITORING, ED - Abnormal; Notable for the following components:   Glucose-Capillary 133 (*)    All other components within normal limits  SARS CORONAVIRUS 2 BY RT PCR (HOSPITAL ORDER, [facility])  MRSA PCR SCREENING  ETHANOL  PROTIME-INR  APTT  CBC  DIFFERENTIAL  RAPID URINE DRUG SCREEN, HOSP PERFORMED  HIV ANTIBODY (ROUTINE TESTING W REFLEX)  CBC  APTT  PROTIME-INR  RAPID URINE DRUG SCREEN, HOSP  PERFORMED  I-STAT CHEM 8, ED    EKG EKG Interpretation  Date/Time:  Sunday February 07 2020 12:55:34 EDT Ventricular Rate:  68 PR Interval:    QRS Duration: 101 QT Interval:  409 QTC Calculation: 435 R Axis:   28 Text Interpretation: Sinus rhythm Baseline wander in lead(s) I III aVL No STEMI Confirmed by Nanda Quinton [phone]) on 02/07/2020 1:04:46 PM   Radiology No results found.  Procedures .Critical Care Performed by: Elnora Morrison, MD Authorized by: Elnora Morrison, MD   Critical care provider statement:    Critical care time (minutes):  35   Critical care start time:  02/07/2020 3:35 PM   Critical care was time spent personally by me on the following activities:  Discussions with consultants, evaluation of patient's response to treatment, examination of patient, ordering and performing treatments and interventions, ordering and review of laboratory studies, ordering and review of radiographic studies, pulse oximetry, re-evaluation of patient's condition, obtaining history from patient or surrogate and review of old charts   (including critical care time)  Medications Ordered in ED Medications  0.9 %  sodium chloride infusion ( Intravenous Stopped 02/08/20 0902)  alteplase (ACTIVASE) 1 mg/mL infusion 82.8 mg (0 mg Intravenous Stopped 02/07/20 1308)    Followed by  0.9 %  sodium chloride infusion (0 mLs Intravenous Stopped 02/07/20 1318)  iohexol (OMNIPAQUE) 350 MG/ML injection 75 mL (75 mLs Intravenous Contrast Given 02/07/20 1217)  LORazepam (ATIVAN) injection 1 mg (1 mg Intravenous Given 02/08/20 1200)  gadobutrol (GADAVIST) 1 MMOL/ML injection 9 mL (9 mLs Intravenous Contrast Given 02/08/20 1219)    ED Course  I have reviewed the triage vital signs and the nursing notes.  Pertinent labs & imaging results that were available during my care of the patient were reviewed by me and considered in my medical decision making (see chart for details).    MDM Rules/Calculators/A&P                       Patient  transferred over to [facility] for continued monitoring, neuro consult and admission to neuro ICU after TPA for acute stroke presentation.  Patient has no worsening or new signs or symptoms on arrival. Discussed with patient plan of care. Neurology consulted and evaluating in the emergency room. CT scan results reviewed from independent and no acute abnormalities.  Final Clinical Impression(s) / ED Diagnoses Final diagnoses:  Cerebrovascular accident (CVA), unspecified mechanism ([place])    Rx / DC Orders ED Discharge Orders         Ordered    metFORMIN (GLUCOPHAGE) 1000 MG tablet  2 times daily     02/08/20 1533    Ambulatory referral to Neurology    Comments: Follow up at [facility] - Bell[facility]   02/08/20 1529    Increase activity slowly     02/08/20 1529    Discharge instructions  Status:  Canceled    Comments: Have nurse give you patient information for Bell's - lacrilube at bedtime. Drops as needed. Avoid anything that may scratch your eye (it may be open and you are unaware).   02/08/20 1529    valACYclovir (VALTREX) 1000 MG tablet  3 times daily     02/08/20 1533    predniSONE (DELTASONE) 20 MG tablet  Daily with breakfast     02/08/20 1533    artificial tears (LACRILUBE) OINT ophthalmic ointment  Daily at bedtime     02/08/20 1533    polyvinyl alcohol (LIQUIFILM TEARS) 1.4 % ophthalmic solution  As needed     02/08/20 1533    Discharge instructions    Comments: Have nurse give you patient information for Bell's - lacrilube at bedtime. Drops as needed. Avoid anything that may scratch your eye (it may be open and you are unaware).  Hold metformin for a total of 2 days as you received dye during hospital based imaging. Ok to resume 48h from last test 02/08/2020  You need outpatient ultrasound of thyroid to evaluate for incidental nodules found during xray   02/08/20 1533           Elnora Morrison, MD 02/11/20 0006

## 2020-02-07 NOTE — ED Notes (Signed)
Spoke with Ruby at [facility] will send transport for patient to go to [facility] Dr. Regenia Skeeter.

## 2020-02-07 NOTE — Consult Note (Addendum)
[facility] TeleNeurology Consult Services   Date of Service:   02/07/2020 11:23:12  Impression:     .  I63.00 - Cerebrovascular accident (CVA) due to thrombosis of precerebral artery ([facility])  Comments/Sign-Out: 54 yr old man with HTN, lipids, with acute onset R face, arm, leg weakness NIH 4 at 10 am. CT head unremarkable. Alteplase discussed with possibility this is an acute CVA. Risks, benefits explained, and he consents. Diff Dx: CVA, bells' palsy, musculoskeletal With possibility of CVA given R arm, leg drift, and R facial droop, alteplase was discussed, recommended as he had no absolute contraindications, and with extremity weakness on R, benefit outweighs risk. Pt consents. Alteplase weight based bolus and infusion ordered, bolus administered without complications. Rec: - Post alteplase ICU care and orders - CVA admit - MRI Brain - get CT angio now, head and neck- I ordered - to rule out LVO - BP control post alteplase keep BP < 180/105 - no antiplatelets, anticoagulation for 24 hours post alteplase - Neurology follow up D/w ER all of the above recs  Metrics: Last Known Well: 02/07/2020 10:00:00 [facility] Notification Time: 02/07/2020 11:23:11 Arrival Time: 02/07/2020 11:12:00 Stamp Time: 02/07/2020 11:23:12 Time First Login Attempt: 02/07/2020 11:29:37 Symptoms: R sided weakness, R facial droop NIHSS Start Assessment Time: 02/07/2020 11:34:00 Alteplase Early Mix Decision Time: 02/07/2020 11:39:00 Patient is a candidate for Alteplase/Activase. Alteplase Medical Decision: 02/07/2020 11:39:00 Alteplase/Activase CPOE Order Time: 02/07/2020 11:47:00 Needle Time: 02/07/2020 11:52:24 Weight Noted by Staff: 202.9 lbs  CT head showed no acute hemorrhage or acute core infarct.  Radiologist was not called back for review of advanced imaging because reviewed ED Physician notified of diagnostic impression and management plan on 02/07/2020 12:00:01  Advanced  Imaging: CTA Head and Neck ordered   Alteplase/Activase Contraindications:  Last Known Well > 4.5 hours: No CT Head showing hemorrhage: No Ischemic stroke within 3 months: No Severe head trauma within 3 months: No Intracranial/intraspinal surgery within 3 months: No History of intracranial hemorrhage: No Symptoms and signs consistent with an SAH: No GI malignancy or GI bleed within 21 days: No Coagulopathy: Platelets <100 000/mm3, INR >1.7, aPTT>40 s, or PT >15 s: No Treatment dose of LMWH within the previous 24 hrs: No Use of NOACs in past 48 hours: No Glycoprotein IIb/IIIa receptor inhibitors use: No Symptoms consistent with infective endocarditis: No Suspected aortic arch dissection: No Intra-axial intracranial neoplasm: No  Verbal Consent to Alteplase/Activase: I have explained to the Patient the nature of the patient's condition, reviewed the indications and contraindications to the use of Alteplase/Activase fibrinolytic agent, reviewed the indications and contraindications and the benefits to be reasonably expected compared with alternative approaches. I have discussed the likelihood of major risks or complications of this procedure including (if applicable) but not limited to loss of limb function, brain damage, paralysis, hemorrhage, infection, complications from transfusion of blood components, drug reactions, blood clots and loss of life. I have also indicated that with any procedure there is always the possibility of an unexpected complication. All questions were answered and Patient express understanding of the treatment plan and consent to the treatment.  Our recommendations are outlined below.  Recommendations: IV Alteplase/Activase recommended.  Alteplase/Activase bolus given Without Complication.   IV Alteplase/Activase Total Dose - 82.8 mg IV Alteplase/Activase Bolus Dose - 8.3 mg IV Alteplase/Activase Infusion Dose - 74.5 mg  Routine post Alteplase/Activase  monitoring including neuro checks and blood pressure control during/after treatment Monitor blood pressure Check blood pressure and neuro assessment every 15 min  for 2 h, then every 30 min for 6 h, and finally every hour for 16 h.  Manage Blood Pressure per post Alteplase/Activase protocol.      .  Admission to ICU     .  CT brain 24 hours post Alteplase/Activase     .  NPO until swallowing screen performed and passed     .  No antiplatelet agents or anticoagulants (including heparin for DVT prophylaxis) in first 24 hours     .  No Foley catheter, nasogastric tube, arterial catheter or central venous catheter for 24 hr, unless absolutely necessary     .  Telemetry     .  Bedside swallow evaluation     .  HOB less than 30 degrees     .  Euglycemia     .  Avoid hyperthermia, PRN acetaminophen     .  DVT prophylaxis     .  Inpatient Neurology Consultation     .  Stroke evaluation as per inpatient neurology recommendations  Discussed with ED physician    ------------------------------------------------------------------------------  History of Present Illness: Patient is a 54 year old Male.  Patient was brought by private transportation with symptoms of R sided weakness, R facial droop  54 yr old man, with HTN, lipids reports around 10 am R facial droop, trouble speaking and also R arm heaviness, with grip of hand. He has had some R shoulder pain for 2 days so he attributed his R arm weakness to possibly that. In the ER, he has R leg drift also and when asked, he feels R leg is heavier than left. He has no hx of CVA. No absolute contraindications for alteplase which were reviewed with him,.   Past Medical History:     . Hypertension     . Hyperlipidemia     Examination: BP(157/88), Pulse(71), Blood Glucose(133) 1A: Level of Consciousness - Alert; keenly responsive + 0 1B: Ask Month and Age - Both Questions Right + 0 1C: Blink Eyes & Squeeze Hands - Performs Both Tasks + 0 2:  Test Horizontal Extraocular Movements - Normal + 0 3: Test Visual Fields - No Visual Loss + 0 4: Test Facial Palsy (Use Grimace if Obtunded) - Partial paralysis (lower face) + 2 5A: Test Left Arm Motor Drift - No Drift for 10 Seconds + 0 5B: Test Right Arm Motor Drift - Drift, but doesn't hit bed + 1 6A: Test Left Leg Motor Drift - No Drift for 5 Seconds + 0 6B: Test Right Leg Motor Drift - Drift, but doesn't hit bed + 1 7: Test Limb Ataxia (FNF/Heel-Shin) - No Ataxia + 0 8: Test Sensation - Normal; No sensory loss + 0 9: Test Language/Aphasia - Normal; No aphasia + 0 10: Test Dysarthria - Normal + 0 11: Test Extinction/Inattention - No abnormality + 0  NIHSS Score: 4  Pre-Morbid Modified Ranking Scale: 0 Points = No symptoms at all   Patient/Family was informed the Neurology Consult would occur via TeleHealth consult by way of interactive audio and video telecommunications and consented to receiving care in this manner.   Patient is being evaluated for possible acute neurologic impairment and high probability of imminent or life-threatening deterioration. I spent total of 35 minutes providing care to this patient, including time for face to face visit via telemedicine, review of medical records, imaging studies and discussion of findings with providers, the patient and/or family.   Dr Lloyd Huger   [facility] [phone]  Case [id number]  Advanced Imaging:  CTA Head and Neck Completed.  LVO:No

## 2020-02-07 NOTE — ED Notes (Signed)
Code stroke paged out patient in [place].

## 2020-02-07 NOTE — Progress Notes (Signed)
Code stroke time documentation:  1120 call time 1129 Beeper time 1122 Exam Started 1127 Exam finished  2119 Images sent to [place] Exam completed in [place] [facility] called

## 2020-02-07 NOTE — Progress Notes (Signed)
RN Marcello Moores called about the patient-concerned blood pressures are trending higher. Has Cleviprex drip ordered but requested as needed antihypertensives. Have ordered labetalol and hydralazine IV as needed with parameters.  -- Amie Portland, MD [facility] Pager: [phone] If 7pm to 7am, please call on call as listed on AMION.

## 2020-02-07 NOTE — ED Notes (Signed)
Neurologist on call sent to Dr. Laverta Baltimore.

## 2020-02-07 NOTE — ED Notes (Signed)
Attempt to call report to [phone]

## 2020-02-07 NOTE — ED Notes (Signed)
Spoke with [place] at [place] to page neurologist on call.

## 2020-02-07 NOTE — ED Triage Notes (Signed)
Pt reports r side of mouth numbness that started at 10am.  Reports numbness went away but left with r sided facial dropping.  Denies other symptoms.  Denies any weakness, numbness, or difficulty swallowing.  EDP at bedside.

## 2020-02-07 NOTE — H&P (Addendum)
Admission H&P    Chief Complaint: Right sided facial weakness  HPI: Jaime Thompson is an 54 y.o. male who initially presented to the [facility] ED with right sided mouth numbness starting at 10 AM while he was at work. The numbness went away but the patient then noticed right sided facial droop. With this, he also noted some dysarthria and liquid/food escaping from the right side of his mouth when eating/drinking.   CT head in the [facility] ED was normal.   Slight right upper extremity weakness was noticed by EDP on exam, which was thought possibly to be due to the ongoing pain in his right shoulder, but was also concerning for possible stroke given the right facial weakness and numbness. [facility]eneurology was consulted at [facility] and decision was made to administer IV tPA. Per Dr. Hayden Pedro note: "Discussed the case with the teleneurologist after their evaluation.  They have concern for acute stroke and have administered TPA after discussion and risk/benefit discussion with the patient.  He is going for CTA but low suspicion overall for LVO.  Will make neurology aware that he will need ICU bed at [facility]. COVID swab sent.":   He has had some right shoulder pain for which he was seen in the ED on 5/30. He denies any other symptoms, including no vision changes, hearing loss, taste changes, confusion, aphasia, limb weakness, limb numbness, CP, palpitations or difficulty breathing.   LSN: 1000 tPA Given: Yes. At OSH.    Past Medical History:  Diagnosis Date  . Cataract    no surgery as yet/pre diagnosed  . Diabetes mellitus without complication ([place])   . Hypertension   . Sleep apnea     Past Surgical History:  Procedure Laterality Date  . bilateral inguinal hernia repair    . HERNIA REPAIR      Family History  Problem Relation Age of Onset  . Colon cancer Neg Hx   . Colon polyps Neg Hx   . Esophageal cancer Neg Hx   . Rectal cancer Neg Hx   . Stomach cancer Neg Hx    Social History:  reports that he has  never smoked. He has never used smokeless tobacco. He reports that he does not drink alcohol or use drugs.  Allergies: No Known Allergies  Facility-Administered Medications Prior to Admission  Medication Dose Route Frequency Provider Last Rate Last Admin  . 0.9 %  sodium chloride infusion  500 mL Intravenous Continuous Ladene Artist, MD       Medications Prior to Admission  Medication Sig Dispense Refill  . acetaminophen (TYLENOL) 500 MG tablet Take 1,000 mg by mouth every 6 (six) hours as needed for headache (pain).    Marland Kitchen atenolol (TENORMIN) 25 MG tablet Take 75 mg by mouth daily.     Marland Kitchen atorvastatin (LIPITOR) 20 MG tablet Take 20 mg by mouth daily.    . cyclobenzaprine (FLEXERIL) 10 MG tablet Take 1 tablet (10 mg total) by mouth 2 (two) times daily as needed for muscle spasms. 20 tablet 0  . diltiazem (CARTIA XT) 120 MG 24 hr capsule Take 120 mg by mouth daily.    Marland Kitchen escitalopram (LEXAPRO) 10 MG tablet Take 10 mg by mouth daily.    Marland Kitchen glipiZIDE (GLUCOTROL XL) 5 MG 24 hr tablet Take 5 mg by mouth daily.    . metFORMIN (GLUCOPHAGE) 1000 MG tablet Take 1,000 mg by mouth 2 (two) times daily.    . montelukast (SINGULAIR) 10 MG tablet Take 10 mg  by mouth daily as needed (seasonal allergies).     . ondansetron (ZOFRAN-ODT) 8 MG disintegrating tablet Take 8 mg by mouth every 8 (eight) hours as needed for nausea or vomiting.     . sildenafil (REVATIO) 20 MG tablet Take 40 mg by mouth daily as needed (erectile dysfunction).     Marland Kitchen telmisartan (MICARDIS) 40 MG tablet Take 40 mg by mouth daily.     . Testosterone 20.25 MG/ACT (1.62%) GEL Apply 20.25 mg topically See admin instructions. Apply 1 pump (20.25 mg) topically to each shoulder once daily      ROS: As per HPI. Comprehensive ROS otherwise negative.   Physical Examination: Blood pressure (!) 159/89, pulse 78, temperature 98.8 F (37.1 C), temperature source Oral, resp. rate 19, height 5\' 8"  (1.727 m), weight 90.4 kg, SpO2 98 %.  HEENT-   [place]/AT. No vesicles or redness of right EAC Lungs - Respirations unlabored Extremities - No edema  Neurologic Examination: Mental Status: Alert, fully oriented, thought content appropriate.  Speech fluent without evidence of aphasia.  Able to follow all commands without difficulty. Cranial Nerves: II:  Visual fields intact, PERRL III,IV, VI: EOMI without double vision. No nystagmus.  V: Decreased temp sensation to right V3. Right V1 and V2 sensation as well as left V1-3 sensation is normal.  VII: Weakness of the upper and lower quadrants of the right face. Weak eyelid closure on the right. Taste with sips of [employer] is normal on left and right sides of tongue. VIII: Hearing normal to pen click bilaterally.  IX,X: Palate rises symmetrically XI: Symmetric shoulder shrug XII: Midline tongue extension  Motor: Right : Upper extremity   5/5    Left:     Upper extremity   5/5  Lower extremity   5/5     Lower extremity   5/5 No pronator drift Sensory: Temp and light touch intact x 4. No extinction to DSS.  Deep Tendon Reflexes:  2+ bilateral biceps, brachioradialis, patellae and achilles.  Plantars: Right: downgoing   Left: downgoing Cerebellar: No ataxia with FNF bilaterally  Gait: Deferred  Results for orders placed or performed during the hospital encounter of 02/07/20 (from the past 48 hour(s))  Urine rapid drug screen (hosp performed)     Status: None   Collection Time: 02/07/20 11:20 AM  Result Value Ref Range   Opiates NONE DETECTED NONE DETECTED   Cocaine NONE DETECTED NONE DETECTED   Benzodiazepines NONE DETECTED NONE DETECTED   Amphetamines NONE DETECTED NONE DETECTED   Tetrahydrocannabinol NONE DETECTED NONE DETECTED   Barbiturates NONE DETECTED NONE DETECTED    Comment: (NOTE) DRUG SCREEN FOR MEDICAL PURPOSES ONLY.  IF CONFIRMATION IS NEEDED FOR ANY PURPOSE, NOTIFY LAB WITHIN 5 DAYS. LOWEST DETECTABLE LIMITS FOR URINE DRUG SCREEN Drug Class                     Cutoff  (ng/mL) Amphetamine and metabolites    1000 Barbiturate and metabolites    200 Benzodiazepine                 497 Tricyclics and metabolites     300 Opiates and metabolites        300 Cocaine and metabolites        300 THC                            50 Performed at [facility], [address] [place],  [place] [address]   Urinalysis, Routine w reflex microscopic     Status: Abnormal   Collection Time: 02/07/20 11:20 AM  Result Value Ref Range   Color, Urine YELLOW YELLOW   APPearance HAZY (A) CLEAR   Specific Gravity, Urine 1.024 1.005 - 1.030   pH 5.0 5.0 - 8.0   Glucose, UA >=500 (A) NEGATIVE mg/dL   Hgb urine dipstick NEGATIVE NEGATIVE   Bilirubin Urine NEGATIVE NEGATIVE   Ketones, ur NEGATIVE NEGATIVE mg/dL   Protein, ur NEGATIVE NEGATIVE mg/dL   Nitrite NEGATIVE NEGATIVE   Leukocytes,Ua NEGATIVE NEGATIVE   RBC / HPF 0-5 0 - 5 RBC/hpf   WBC, UA 0-5 0 - 5 WBC/hpf   Bacteria, UA NONE SEEN NONE SEEN   Squamous Epithelial / LPF 0-5 0 - 5   Mucus PRESENT    Ca Oxalate Crys, UA PRESENT     Comment: Performed at [facility], [address] [place], [place] [address]  Ethanol     Status: None   Collection Time: 02/07/20 11:30 AM  Result Value Ref Range   Alcohol, Ethyl (B) <10 <10 mg/dL    Comment: (NOTE) Lowest detectable limit for serum alcohol is 10 mg/dL. For medical purposes only. Performed at [facility], [address], [place], [place] [address]   Protime-INR     Status: None   Collection Time: 02/07/20 11:30 AM  Result Value Ref Range   Prothrombin Time 12.8 11.4 - 15.2 seconds   INR 1.0 0.8 - 1.2    Comment: (NOTE) INR goal varies based on device and disease states. Performed at [facility], [address] [place], [place] [address]   APTT     Status: None   Collection Time: 02/07/20 11:30 AM  Result Value Ref Range   aPTT 28 24 - 36 seconds    Comment: Performed at [facility], [address], [place], [place] [address]  CBC     Status: None    Collection Time: 02/07/20 11:30 AM  Result Value Ref Range   WBC 7.9 4.0 - 10.5 K/uL   RBC 4.70 4.22 - 5.81 MIL/uL   Hemoglobin 13.8 13.0 - 17.0 g/dL   HCT 42.8 39.0 - 52.0 %   MCV 91.1 80.0 - 100.0 fL   MCH 29.4 26.0 - 34.0 pg   MCHC 32.2 30.0 - 36.0 g/dL   RDW 13.6 11.5 - 15.5 %   Platelets 278 150 - 400 K/uL   nRBC 0.0 0.0 - 0.2 %    Comment: Performed at [facility], [address] [place], [place] [address]  Differential     Status: None   Collection Time: 02/07/20 11:30 AM  Result Value Ref Range   Neutrophils Relative % 60 %   Neutro Abs 4.8 1.7 - 7.7 K/uL   Lymphocytes Relative 28 %   Lymphs Abs 2.2 0.7 - 4.0 K/uL   Monocytes Relative 7 %   Monocytes Absolute 0.6 0.1 - 1.0 K/uL   Eosinophils Relative 3 %   Eosinophils Absolute 0.2 0.0 - 0.5 K/uL   Basophils Relative 1 %   Basophils Absolute 0.0 0.0 - 0.1 K/uL   Immature Granulocytes 1 %   Abs Immature Granulocytes 0.04 0.00 - 0.07 K/uL    Comment: Performed at [facility], [address], [place], [place] [address]  Comprehensive metabolic panel     Status: Abnormal   Collection Time: 02/07/20 11:30 AM  Result Value Ref Range   Sodium 140 135 - 145  mmol/L   Potassium 3.5 3.5 - 5.1 mmol/L   Chloride 105 98 - 111 mmol/L   CO2 24 22 - 32 mmol/L   Glucose, Bld 141 (H) 70 - 99 mg/dL    Comment: Glucose reference range applies only to samples taken after fasting for at least 8 hours.   BUN 15 6 - 20 mg/dL   Creatinine, Ser 0.97 0.61 - 1.24 mg/dL   Calcium 8.9 8.9 - 10.3 mg/dL   Total Protein 7.4 6.5 - 8.1 g/dL   Albumin 4.3 3.5 - 5.0 g/dL   AST 24 15 - 41 U/L   ALT 40 0 - 44 U/L   Alkaline Phosphatase 64 38 - 126 U/L   Total Bilirubin 0.6 0.3 - 1.2 mg/dL   GFR calc non Af Amer >60 >60 mL/min   GFR calc Af Amer >60 >60 mL/min   Anion gap 11 5 - 15    Comment: Performed at [facility], [address], [place], [place] [address]  CBG monitoring, ED     Status: Abnormal   Collection Time: 02/07/20 11:32 AM  Result  Value Ref Range   Glucose-Capillary 133 (H) 70 - 99 mg/dL    Comment: Glucose reference range applies only to samples taken after fasting for at least 8 hours.  SARS Coronavirus 2 by RT PCR (hospital order, performed in [facility] hospital lab) Nasopharyngeal Nasopharyngeal Swab     Status: None   Collection Time: 02/07/20 11:59 AM   Specimen: Nasopharyngeal Swab  Result Value Ref Range   SARS Coronavirus 2 NEGATIVE NEGATIVE    Comment: (NOTE) SARS-CoV-2 target nucleic acids are NOT DETECTED. The SARS-CoV-2 RNA is generally detectable in upper and lower respiratory specimens during the acute phase of infection. The lowest concentration of SARS-CoV-2 viral copies this assay can detect is 250 copies / mL. A negative result does not preclude SARS-CoV-2 infection and should not be used as the sole basis for treatment or other patient management decisions.  A negative result may occur with improper specimen collection / handling, submission of specimen other than nasopharyngeal swab, presence of viral mutation(s) within the areas targeted by this assay, and inadequate number of viral copies (<250 copies / mL). A negative result must be combined with clinical observations, patient history, and epidemiological information. Fact Sheet for Patients:   StrictlyIdeas.no Fact Sheet for Healthcare Providers: BankingDealers.co.za This test is not yet approved or cleared  by the [place] FDA and has been authorized for detection and/or diagnosis of SARS-CoV-2 by FDA under an Emergency Use Authorization (EUA).  This EUA will remain in effect (meaning this test can be used) for the duration of the COVID-19 declaration under Section 564(b)(1) of the Act, 21 U.S.C. section 360bbb-3(b)(1), unless the authorization is terminated or revoked sooner. Performed at [facility], [address], [place], [place] [address]   MRSA PCR Screening     Status: None    Collection Time: 02/07/20  4:15 PM   Specimen: Nasal Mucosa; Nasopharyngeal  Result Value Ref Range   MRSA by PCR NEGATIVE NEGATIVE    Comment:        The GeneXpert MRSA Assay (FDA approved for NASAL specimens only), is one component of a comprehensive MRSA colonization surveillance program. It is not intended to diagnose MRSA infection nor to guide or monitor treatment for MRSA infections. Performed at [facility], [place] [address], [place], [place] [address]   HIV Antibody (routine testing w rflx)  Status: None   Collection Time: 02/07/20  4:35 PM  Result Value Ref Range   HIV Screen 4th Generation wRfx Non Reactive Non Reactive    Comment: Performed at [facility], [place] [address], [place] [address]  CBC     Status: None   Collection Time: 02/07/20  4:35 PM  Result Value Ref Range   WBC 8.6 4.0 - 10.5 K/uL   RBC 4.60 4.22 - 5.81 MIL/uL   Hemoglobin 13.4 13.0 - 17.0 g/dL   HCT 41.6 39.0 - 52.0 %   MCV 90.4 80.0 - 100.0 fL   MCH 29.1 26.0 - 34.0 pg   MCHC 32.2 30.0 - 36.0 g/dL   RDW 13.5 11.5 - 15.5 %   Platelets 264 150 - 400 K/uL   nRBC 0.0 0.0 - 0.2 %    Comment: Performed at [facility], [place] [address], [place], [place] [address]  APTT     Status: None   Collection Time: 02/07/20  4:35 PM  Result Value Ref Range   aPTT 29 24 - 36 seconds    Comment: Performed at [facility] [address], [place], [place] [address]  Protime-INR     Status: None   Collection Time: 02/07/20  4:35 PM  Result Value Ref Range   Prothrombin Time 13.3 11.4 - 15.2 seconds   INR 1.1 0.8 - 1.2    Comment: (NOTE) INR goal varies based on device and disease states. Performed at [facility], [place] [address], [place], [place] [address]   Urine rapid drug screen (hosp performed)not at [facility]     Status: None   Collection Time: 02/07/20  5:33 PM  Result Value Ref Range   Opiates NONE DETECTED NONE DETECTED   Cocaine NONE DETECTED NONE DETECTED    Benzodiazepines NONE DETECTED NONE DETECTED   Amphetamines NONE DETECTED NONE DETECTED   Tetrahydrocannabinol NONE DETECTED NONE DETECTED   Barbiturates NONE DETECTED NONE DETECTED    Comment: (NOTE) DRUG SCREEN FOR MEDICAL PURPOSES ONLY.  IF CONFIRMATION IS NEEDED FOR ANY PURPOSE, NOTIFY LAB WITHIN 5 DAYS. LOWEST DETECTABLE LIMITS FOR URINE DRUG SCREEN Drug Class                     Cutoff (ng/mL) Amphetamine and metabolites    1000 Barbiturate and metabolites    200 Benzodiazepine                 250 Tricyclics and metabolites     300 Opiates and metabolites        300 Cocaine and metabolites        300 THC                            50 Performed at [facility], [place] [address], [place], [place] [address]   Urinalysis, Complete w Microscopic     Status: Abnormal   Collection Time: 02/07/20  5:33 PM  Result Value Ref Range   Color, Urine YELLOW YELLOW   APPearance CLEAR CLEAR   Specific Gravity, Urine >1.046 (H) 1.005 - 1.030   pH 6.0 5.0 - 8.0   Glucose, UA 50 (A) NEGATIVE mg/dL   Hgb urine dipstick NEGATIVE NEGATIVE   Bilirubin Urine NEGATIVE NEGATIVE   Ketones, ur NEGATIVE NEGATIVE mg/dL   Protein, ur NEGATIVE NEGATIVE mg/dL   Nitrite NEGATIVE NEGATIVE   Leukocytes,Ua NEGATIVE NEGATIVE  RBC / HPF 0-5 0 - 5 RBC/hpf   WBC, UA 0-5 0 - 5 WBC/hpf   Bacteria, UA NONE SEEN NONE SEEN   Mucus PRESENT     Comment: Performed at [facility], [place] [address] [place], [place] [address]   CT HEAD CODE STROKE WO CONTRAST  Result Date: 02/07/2020 CLINICAL DATA:  Code stroke.  Right facial droop and slurred speech. EXAM: CT HEAD WITHOUT CONTRAST TECHNIQUE: Contiguous axial images were obtained from the base of the skull through the vertex without intravenous contrast. COMPARISON:  None. FINDINGS: Brain: Normal appearance without evidence of old or acute infarction, mass lesion, hemorrhage, hydrocephalus or extra-axial collection. Vascular: No abnormal vascular finding.  Skull: Normal Sinuses/Orbits: Clear/normal Other: None ASPECTS ([place] Stroke Program Early CT Score) - Ganglionic level infarction (caudate, lentiform nuclei, internal capsule, insula, M1-M3 cortex): 7 - Supraganglionic infarction (M4-M6 cortex): 3 Total score (0-10 with 10 being normal): 10 IMPRESSION: 1. Normal head CT 2. ASPECTS is 10 3. These results were called by telephone at the time of interpretation on 02/07/2020 at 11:29 am to provider JOSHUA LONG , who verbally acknowledged these results. Electronically Signed   By: Nelson Chimes M.D.   On: 02/07/2020 11:29   CT ANGIO HEAD CODE STROKE  Result Date: 02/07/2020 CLINICAL DATA:  Right facial droop and slurred speech. EXAM: CT ANGIOGRAPHY HEAD AND NECK TECHNIQUE: Multidetector CT imaging of the head and neck was performed using the standard protocol during bolus administration of intravenous contrast. Multiplanar CT image reconstructions and MIPs were obtained to evaluate the vascular anatomy. Carotid stenosis measurements (when applicable) are obtained utilizing NASCET criteria, using the distal internal carotid diameter as the denominator. CONTRAST:  52mL OMNIPAQUE IOHEXOL 350 MG/ML SOLN COMPARISON:  Head CT same day. FINDINGS: CTA NECK FINDINGS Aortic arch: Normal Right carotid system: Minimal atherosclerotic plaque at the ICA bulb. No stenosis or irregularity. Left carotid system: Normal Vertebral arteries: Normal Skeleton: Minimal mid cervical spondylosis. Other neck: No lymphadenopathy. 13 mm thyroid nodule posteriorly on the right. 15 mm thyroid nodule on the left. Upper chest: Normal Review of the MIP images confirms the above findings CTA HEAD FINDINGS Anterior circulation: Both internal carotid arteries widely patent through the skull base and siphon regions. No stenosis. The anterior and middle cerebral vessels are normal. No large or medium vessel occlusion. No stenosis or aneurysm. Posterior circulation: Both vertebral arteries widely patent to  the basilar. No basilar stenosis. Posterior circulation branch vessels are normal. Venous sinuses: Patent and normal. Anatomic variants: None significant. Review of the MIP images confirms the above findings IMPRESSION: Normal CT angiography of the neck and intracranial vessels. Thyroid nodules as above. Recommend thyroid US (ref: J Am Coll Radiol. 2015 Feb;12(2): 143-50). Electronically Signed   By: Nelson Chimes M.D.   On: 02/07/2020 12:23   CT ANGIO NECK CODE STROKE  Result Date: 02/07/2020 CLINICAL DATA:  Right facial droop and slurred speech. EXAM: CT ANGIOGRAPHY HEAD AND NECK TECHNIQUE: Multidetector CT imaging of the head and neck was performed using the standard protocol during bolus administration of intravenous contrast. Multiplanar CT image reconstructions and MIPs were obtained to evaluate the vascular anatomy. Carotid stenosis measurements (when applicable) are obtained utilizing NASCET criteria, using the distal internal carotid diameter as the denominator. CONTRAST:  2mL OMNIPAQUE IOHEXOL 350 MG/ML SOLN COMPARISON:  Head CT same day. FINDINGS: CTA NECK FINDINGS Aortic arch: Normal Right carotid system: Minimal atherosclerotic plaque at the ICA bulb. No stenosis or irregularity. Left carotid system:  Normal Vertebral arteries: Normal Skeleton: Minimal mid cervical spondylosis. Other neck: No lymphadenopathy. 13 mm thyroid nodule posteriorly on the right. 15 mm thyroid nodule on the left. Upper chest: Normal Review of the MIP images confirms the above findings CTA HEAD FINDINGS Anterior circulation: Both internal carotid arteries widely patent through the skull base and siphon regions. No stenosis. The anterior and middle cerebral vessels are normal. No large or medium vessel occlusion. No stenosis or aneurysm. Posterior circulation: Both vertebral arteries widely patent to the basilar. No basilar stenosis. Posterior circulation branch vessels are normal. Venous sinuses: Patent and normal. Anatomic  variants: None significant. Review of the MIP images confirms the above findings IMPRESSION: Normal CT angiography of the neck and intracranial vessels. Thyroid nodules as above. Recommend thyroid US (ref: J Am Coll Radiol. 2015 Feb;12(2): 143-50). Electronically Signed   By: Nelson Chimes M.D.   On: 02/07/2020 12:23    Assessment: 54 y.o. male presenting in transfer from OSH after receiving IV tPA for presumed acute stroke 1. Exam reveals findings that are most consistent with a right sided Bell's palsy. A small brainstem stroke involving the CN VII nucleus is possible, but this would be expected to be seen in conjunction with other deficits.  2. CT head: Normal.  3. CTA of head and neck: Normal. Thyroid nodules are noted.  4. Stroke Risk Factors - DM, HTN and sleep apnea  Plan: 1. Admitting to the Neuro ICU under the Neurology service  2. Post-tPA order set to include frequent neuro checks and BP management.  3. No antiplatelet medications or anticoagulants for at least 24 hours following tPA.  4. DVT prophylaxis with SCDs.  5. If no stroke is seen on MRI, then will not likely need to be started on an antiplatelet medication.  6. MRI brain. If stroke is seen, then obtain TTE 7. Speech therapy consult for strategies to prevent escape of food and liquids from right side of mouth.  8. Sliding scale insulin.  9. Telemetry monitoring 10. Fasting lipid panel, HgbA1c  Electronically signed: Dr. Kerney Elbe 02/07/2020, 7:45 PM

## 2020-02-07 NOTE — ED Notes (Signed)
1145 pt reports symptoms started while he was at work.

## 2020-02-07 NOTE — ED Provider Notes (Signed)
Emergency Department Provider Note   I have reviewed the triage vital signs and the nursing notes.   HISTORY  Chief Complaint facial droop   HPI Jaime Thompson is a 54 y.o. male with past history reviewed below including diabetes and hypertension presents with acute onset right face weakness.  He states that he initially noticed some numbness feeling like "Novocain" just to the right of his mouth but that has since resolved.  He denies vision disturbance.  No symptoms such as weakness/numbness in the right arm or leg.  No prior history of stroke.  No radiation of symptoms or other modifying factors.   States he was seen recently for pain in his right shoulder which is mostly resolved but still has some slight residual discomfort there. No CP or heart palpitations.    Past Medical History:  Diagnosis Date  . Cataract    no surgery as yet/pre diagnosed  . Diabetes mellitus without complication ([place])   . Hypertension   . Sleep apnea     Patient Active Problem List   Diagnosis Date Noted  . CVA (cerebral vascular accident) ([place]) 02/07/2020    Past Surgical History:  Procedure Laterality Date  . bilateral inguinal hernia repair    . HERNIA REPAIR      Allergies Patient has no known allergies.  Family History  Problem Relation Age of Onset  . Colon cancer Neg Hx   . Colon polyps Neg Hx   . Esophageal cancer Neg Hx   . Rectal cancer Neg Hx   . Stomach cancer Neg Hx     Social History Social History   Tobacco Use  . Smoking status: Never Smoker  . Smokeless tobacco: Never Used  Substance Use Topics  . Alcohol use: No  . Drug use: No    Review of Systems  Constitutional: No fever/chills Eyes: No visual changes. ENT: No sore throat. Cardiovascular: Denies chest pain. Respiratory: Denies shortness of breath. Gastrointestinal: No abdominal pain.  No nausea, no vomiting.  No diarrhea.  No constipation. Genitourinary: Negative for dysuria. Musculoskeletal:  Negative for back pain. Skin: Negative for rash. Neurological: Negative for headaches. Right face weakness. Normal sensation.    10-point ROS otherwise negative.  ____________________________________________   PHYSICAL EXAM:  VITAL SIGNS: ED Triage Vitals  Enc Vitals Group     BP --      Pulse --      Resp --      Temp --      Temp src --      SpO2 --      Weight 02/07/20 1118 200 lb (90.7 kg)     Height --      Head Circumference --      Peak Flow --      Pain Score 02/07/20 1117 0     Pain Loc --      Pain Edu? --      Excl. in [place]? --    Constitutional: Alert and oriented. Well appearing and in no acute distress. Eyes: Conjunctivae are normal. PERRL. EOMI. Head: Atraumatic. Nose: No congestion/rhinnorhea. Mouth/Throat: Mucous membranes are moist.  Neck: No stridor.  Cardiovascular: Normal rate, regular rhythm. Good peripheral circulation. Grossly normal heart sounds.   Respiratory: Normal respiratory effort.  No retractions. Lungs CTAB. Gastrointestinal: No distention.  Musculoskeletal: No lower extremity tenderness nor edema. No gross deformities of extremities. Neurologic:  Normal speech and language. Forehead strength slightly preserved on the right although is weak there as well.  Patient with very subtle weakness in the right upper extremity.  Equal grip strength.  Normal strength and sensation in the bilateral lower extremities. Skin:  Skin is warm, dry and intact. No rash noted.  ____________________________________________   LABS (all labs ordered are listed, but only abnormal results are displayed)  Labs Reviewed  COMPREHENSIVE METABOLIC PANEL - Abnormal; Notable for the following components:      Result Value   Glucose, Bld 141 (*)    All other components within normal limits  URINALYSIS, ROUTINE W REFLEX MICROSCOPIC - Abnormal; Notable for the following components:   APPearance HAZY (*)    Glucose, UA >=500 (*)    All other components within normal  limits  CBG MONITORING, ED - Abnormal; Notable for the following components:   Glucose-Capillary 133 (*)    All other components within normal limits  SARS CORONAVIRUS 2 BY RT PCR (HOSPITAL ORDER, Agency LAB)  ETHANOL  PROTIME-INR  APTT  CBC  DIFFERENTIAL  RAPID URINE DRUG SCREEN, HOSP PERFORMED  I-STAT CHEM 8, ED   ____________________________________________  EKG   EKG Interpretation  Date/Time:    Ventricular Rate:    PR Interval:    QRS Duration:   QT Interval:    QTC Calculation:   R Axis:     Text Interpretation:         ____________________________________________  RADIOLOGY  CT HEAD CODE STROKE WO CONTRAST  Result Date: 02/07/2020 CLINICAL DATA:  Code stroke.  Right facial droop and slurred speech. EXAM: CT HEAD WITHOUT CONTRAST TECHNIQUE: Contiguous axial images were obtained from the base of the skull through the vertex without intravenous contrast. COMPARISON:  None. FINDINGS: Brain: Normal appearance without evidence of old or acute infarction, mass lesion, hemorrhage, hydrocephalus or extra-axial collection. Vascular: No abnormal vascular finding. Skull: Normal Sinuses/Orbits: Clear/normal Other: None ASPECTS ([place] Stroke Program Early CT Score) - Ganglionic level infarction (caudate, lentiform nuclei, internal capsule, insula, M1-M3 cortex): 7 - Supraganglionic infarction (M4-M6 cortex): 3 Total score (0-10 with 10 being normal): 10 IMPRESSION: 1. Normal head CT 2. ASPECTS is 10 3. These results were called by telephone at the time of interpretation on 02/07/2020 at 11:29 am to provider Maila Dukes , who verbally acknowledged these results. Electronically Signed   By: Nelson Chimes M.D.   On: 02/07/2020 11:29   CT ANGIO HEAD CODE STROKE  Result Date: 02/07/2020 CLINICAL DATA:  Right facial droop and slurred speech. EXAM: CT ANGIOGRAPHY HEAD AND NECK TECHNIQUE: Multidetector CT imaging of the head and neck was performed using the standard  protocol during bolus administration of intravenous contrast. Multiplanar CT image reconstructions and MIPs were obtained to evaluate the vascular anatomy. Carotid stenosis measurements (when applicable) are obtained utilizing NASCET criteria, using the distal internal carotid diameter as the denominator. CONTRAST:  55mL OMNIPAQUE IOHEXOL 350 MG/ML SOLN COMPARISON:  Head CT same day. FINDINGS: CTA NECK FINDINGS Aortic arch: Normal Right carotid system: Minimal atherosclerotic plaque at the ICA bulb. No stenosis or irregularity. Left carotid system: Normal Vertebral arteries: Normal Skeleton: Minimal mid cervical spondylosis. Other neck: No lymphadenopathy. 13 mm thyroid nodule posteriorly on the right. 15 mm thyroid nodule on the left. Upper chest: Normal Review of the MIP images confirms the above findings CTA HEAD FINDINGS Anterior circulation: Both internal carotid arteries widely patent through the skull base and siphon regions. No stenosis. The anterior and middle cerebral vessels are normal. No large or medium vessel occlusion. No stenosis or aneurysm. Posterior circulation:  Both vertebral arteries widely patent to the basilar. No basilar stenosis. Posterior circulation branch vessels are normal. Venous sinuses: Patent and normal. Anatomic variants: None significant. Review of the MIP images confirms the above findings IMPRESSION: Normal CT angiography of the neck and intracranial vessels. Thyroid nodules as above. Recommend thyroid US (ref: J Am Coll Radiol. 2015 Feb;12(2): 143-50). Electronically Signed   By: Nelson Chimes M.D.   On: 02/07/2020 12:23   CT ANGIO NECK CODE STROKE  Result Date: 02/07/2020 CLINICAL DATA:  Right facial droop and slurred speech. EXAM: CT ANGIOGRAPHY HEAD AND NECK TECHNIQUE: Multidetector CT imaging of the head and neck was performed using the standard protocol during bolus administration of intravenous contrast. Multiplanar CT image reconstructions and MIPs were obtained to  evaluate the vascular anatomy. Carotid stenosis measurements (when applicable) are obtained utilizing NASCET criteria, using the distal internal carotid diameter as the denominator. CONTRAST:  59mL OMNIPAQUE IOHEXOL 350 MG/ML SOLN COMPARISON:  Head CT same day. FINDINGS: CTA NECK FINDINGS Aortic arch: Normal Right carotid system: Minimal atherosclerotic plaque at the ICA bulb. No stenosis or irregularity. Left carotid system: Normal Vertebral arteries: Normal Skeleton: Minimal mid cervical spondylosis. Other neck: No lymphadenopathy. 13 mm thyroid nodule posteriorly on the right. 15 mm thyroid nodule on the left. Upper chest: Normal Review of the MIP images confirms the above findings CTA HEAD FINDINGS Anterior circulation: Both internal carotid arteries widely patent through the skull base and siphon regions. No stenosis. The anterior and middle cerebral vessels are normal. No large or medium vessel occlusion. No stenosis or aneurysm. Posterior circulation: Both vertebral arteries widely patent to the basilar. No basilar stenosis. Posterior circulation branch vessels are normal. Venous sinuses: Patent and normal. Anatomic variants: None significant. Review of the MIP images confirms the above findings IMPRESSION: Normal CT angiography of the neck and intracranial vessels. Thyroid nodules as above. Recommend thyroid US (ref: J Am Coll Radiol. 2015 Feb;12(2): 143-50). Electronically Signed   By: Nelson Chimes M.D.   On: 02/07/2020 12:23    ____________________________________________   PROCEDURES  Procedure(s) performed:   Procedures  CRITICAL CARE Performed by: Margette Fast Total critical care time: 35 minutes Critical care time was exclusive of separately billable procedures and treating other patients. Critical care was necessary to treat or prevent imminent or life-threatening deterioration. Critical care was time spent personally by me on the following activities: development of treatment plan  with patient and/or surrogate as well as nursing, discussions with consultants, evaluation of patient's response to treatment, examination of patient, obtaining history from patient or surrogate, ordering and performing treatments and interventions, ordering and review of laboratory studies, ordering and review of radiographic studies, pulse oximetry and re-evaluation of patient's condition.  Nanda Quinton, MD Emergency Medicine  ____________________________________________   INITIAL IMPRESSION / ASSESSMENT AND PLAN / ED COURSE  Pertinent labs & imaging results that were available during my care of the patient were reviewed by me and considered in my medical decision making (see chart for details).   Patient presents emergency department with likely Bell's palsy with onset 1 hour prior to ED presentation.  Some of the features of his exam are slightly atypical including his initial feeling of numbness to the right of his mouth as well as some preserved right forehead strength on exam.  He also has a very slight weakness in his right upper extremity but this could be related to ongoing pain in his right shoulder.  We will have teleneurology evaluate as a code  stroke and de-escalate from there as needed.   12:11 PM  Discussed the case with the teleneurologist after their evaluation.  They have concern for acute stroke and have administered TPA after discussion and risk/benefit discussion with the patient.  He is going for CTA but low suspicion overall for LVO.  Will make neurology aware that he will need ICU bed at [facility]. COVID swab sent.   12:19 PM  Spoke with Dr. Cheral Marker with Neuro regarding the case.  Will wait on CTA results and dispo either to the emergency department or place order for ICU bed at that time.   12:52 PM  Spoke with Dr. Cheral Marker regarding the CTA results.  No LVO.  I have placed temporary bed request orders to the ICU at [facility] at his request.   Discussed patient's case  with Neurology, Dr. Cheral Marker to request admission. Patient and family (if present) updated with plan. Care transferred to Neurology service.  I reviewed all nursing notes, vitals, pertinent old records, EKGs, labs, imaging (as available).  01:19 PM  No ICU beds available at [facility] at this time.  Discussed the case with Dr. Regenia Skeeter who accepts the patient in ED to ED transfer to [facility] where his neurology evaluation can began along with consultation and MRI. Patient re-evaluated and feeling well. Updated wife at bedside.  ____________________________________________  FINAL CLINICAL IMPRESSION(S) / ED DIAGNOSES  Final diagnoses:  Cerebrovascular accident (CVA), unspecified mechanism ([place])    MEDICATIONS GIVEN DURING THIS VISIT:  Medications  alteplase (ACTIVASE) 1 mg/mL infusion 82.8 mg (82.8 mg Intravenous New Bag/Given 02/07/20 1152)    Followed by  0.9 %  sodium chloride infusion (has no administration in time range)  iohexol (OMNIPAQUE) 350 MG/ML injection 75 mL (75 mLs Intravenous Contrast Given 02/07/20 1217)    Note:  This document was prepared using Dragon voice recognition software and may include unintentional dictation errors.  Nanda Quinton, MD, [facility] Emergency Medicine    Leshay Desaulniers, Wonda Old[facility], MD 02/07/20 [phone]

## 2020-02-07 NOTE — Progress Notes (Signed)
Patient arrived to 4N15 at 1, a/ox4 on arrival. Complete neuro assessment and NIH as charted. Personal belongings with patient include: Clothing, glasses, cell phone Wife Manuela Schwartz at bedside.  Candy Sledge, RN

## 2020-02-08 ENCOUNTER — Other Ambulatory Visit: Payer: Self-pay

## 2020-02-08 ENCOUNTER — Inpatient Hospital Stay (HOSPITAL_COMMUNITY): Payer: BC Managed Care – PPO

## 2020-02-08 DIAGNOSIS — I10 Essential (primary) hypertension: Secondary | ICD-10-CM | POA: Diagnosis present

## 2020-02-08 DIAGNOSIS — E785 Hyperlipidemia, unspecified: Secondary | ICD-10-CM | POA: Diagnosis present

## 2020-02-08 DIAGNOSIS — G510 Bell's palsy: Secondary | ICD-10-CM | POA: Diagnosis not present

## 2020-02-08 DIAGNOSIS — E041 Nontoxic single thyroid nodule: Secondary | ICD-10-CM

## 2020-02-08 DIAGNOSIS — I6389 Other cerebral infarction: Secondary | ICD-10-CM

## 2020-02-08 DIAGNOSIS — E119 Type 2 diabetes mellitus without complications: Secondary | ICD-10-CM

## 2020-02-08 LAB — ECHOCARDIOGRAM COMPLETE
Height: 68 in
Weight: 3188.73 oz

## 2020-02-08 LAB — LIPID PANEL
Cholesterol: 117 mg/dL (ref 0–200)
HDL: 37 mg/dL — ABNORMAL LOW (ref >40)
LDL Cholesterol: 65 mg/dL (ref 0–99)
Total CHOL/HDL Ratio: 3.2 RATIO
Triglycerides: 77 mg/dL (ref <150)
VLDL: 15 mg/dL (ref 0–40)

## 2020-02-08 LAB — GLUCOSE, CAPILLARY
Glucose-Capillary: 127 mg/dL — ABNORMAL HIGH (ref 70–99)
Glucose-Capillary: 133 mg/dL — ABNORMAL HIGH (ref 70–99)

## 2020-02-08 LAB — HEMOGLOBIN A1C
Hgb A1c MFr Bld: 7.2 % — ABNORMAL HIGH (ref 4.8–5.6)
Mean Plasma Glucose: 159.94 mg/dL

## 2020-02-08 MED ORDER — VALACYCLOVIR HCL 500 MG PO TABS
1000.0000 mg | ORAL_TABLET | Freq: Three times a day (TID) | ORAL | Status: DC
  Filled 2020-02-08 (×2): qty 2

## 2020-02-08 MED ORDER — POLYVINYL ALCOHOL 1.4 % OP SOLN: 1.0000 [drp] | OPHTHALMIC | 0 refills | Status: AC | PRN

## 2020-02-08 MED ORDER — VALACYCLOVIR HCL 1 G PO TABS: 1000.0000 mg | ORAL_TABLET | Freq: Three times a day (TID) | ORAL | 0 refills | Status: DC

## 2020-02-08 MED ORDER — HYPROMELLOSE (GONIOSCOPIC) 2.5 % OP SOLN: 1.0000 [drp] | OPHTHALMIC | Status: DC | PRN

## 2020-02-08 MED ORDER — ESCITALOPRAM OXALATE 10 MG PO TABS
10.0000 mg | ORAL_TABLET | Freq: Every day | ORAL | Status: DC
  Administered 2020-02-08: 10 mg via ORAL
  Filled 2020-02-08: qty 1

## 2020-02-08 MED ORDER — LORAZEPAM 2 MG/ML IJ SOLN
1.0000 mg | Freq: Once | INTRAMUSCULAR | Status: AC
  Administered 2020-02-08: 1 mg via INTRAVENOUS
  Filled 2020-02-08: qty 1

## 2020-02-08 MED ORDER — ARTIFICIAL TEARS OPHTHALMIC OINT: TOPICAL_OINTMENT | Freq: Every day | OPHTHALMIC | Status: DC

## 2020-02-08 MED ORDER — GLIPIZIDE ER 5 MG PO TB24
5.0000 mg | ORAL_TABLET | Freq: Every day | ORAL | Status: DC
  Administered 2020-02-08: 5 mg via ORAL
  Filled 2020-02-08: qty 1

## 2020-02-08 MED ORDER — POLYVINYL ALCOHOL 1.4 % OP SOLN
1.0000 [drp] | OPHTHALMIC | Status: DC | PRN
  Filled 2020-02-08: qty 15

## 2020-02-08 MED ORDER — PREDNISONE 20 MG PO TABS: 60.0000 mg | ORAL_TABLET | Freq: Every day | ORAL | Status: DC

## 2020-02-08 MED ORDER — METFORMIN HCL 1000 MG PO TABS: 1000.0000 mg | ORAL_TABLET | Freq: Two times a day (BID) | ORAL | Status: AC

## 2020-02-08 MED ORDER — GADOBUTROL 1 MMOL/ML IV SOLN
9.0000 mL | Freq: Once | INTRAVENOUS | Status: AC | PRN
  Administered 2020-02-08: 9 mL via INTRAVENOUS

## 2020-02-08 MED ORDER — ARTIFICIAL TEARS OPHTHALMIC OINT: TOPICAL_OINTMENT | Freq: Every day | OPHTHALMIC | 2 refills | Status: AC

## 2020-02-08 MED ORDER — ATORVASTATIN CALCIUM 10 MG PO TABS
20.0000 mg | ORAL_TABLET | Freq: Every day | ORAL | Status: DC
  Administered 2020-02-08: 20 mg via ORAL
  Filled 2020-02-08: qty 2

## 2020-02-08 MED ORDER — PREDNISONE 20 MG PO TABS: 60.0000 mg | ORAL_TABLET | Freq: Every day | ORAL | 0 refills | Status: DC

## 2020-02-08 NOTE — Evaluation (Signed)
Occupational Therapy Evaluation and Discharge Patient Details Name: Jaime Thompson MRN: [id number] DOB: 07/16/1966 Today's Date: 02/08/2020    History of Present Illness Jaime Thompson is an 54 y.o. male who initially presented to [facility] with right sided mouth numbness starting at 10 AM while he was at work. The numbness went away but the patient then noticed right sided facial droop. With this, he also noted some dysarthria and liquid/food escaping from the right side of his mouth when eating/drinking. Pt was given tPA then transferred to [facility]. CT negative, awaiting MRI. CVA vs Bells Palsy.   Clinical Impression   This 54 yo male admitted with above presents to acute OT at an independent level for basic ADLs. He definitely has right facial droop with minimal blinking and decreased right eye lid closer (ruling out CVA v. Bell's Palsy). No further OT needs we will sign off.    Follow Up Recommendations  No OT follow up    Equipment Recommendations  None recommended by OT       Precautions / Restrictions Precautions Precautions: None Restrictions Weight Bearing Restrictions: No      Mobility Bed Mobility Overal bed mobility: Modified Independent             General bed mobility comments: HOB Elevated  Transfers Overall transfer level: Independent Equipment used: None Transfers: Sit to/from Stand Sit to Stand: Supervision         General transfer comment: supervision for safety/line management    Balance Overall balance assessment: No apparent balance deficits (not formally assessed)                                         ADL either performed or assessed with clinical judgement   ADL Overall ADL's : Independent                                        Did speak with pt and wife about at night he could tape his eye lid shut with paper tape or get a patch to wear for sleeping so that his eye would not get so dry.     Vision  Baseline Vision/History: Wears glasses Wears Glasses: At all times Patient Visual Report: No change from baseline Vision Assessment?: Yes Eye Alignment: Within Functional Limits Ocular Range of Motion: Within Functional Limits Alignment/Gaze Preference: Within Defined Limits Tracking/Visual Pursuits: Able to track stimulus in all quads without difficulty Saccades: Within functional limits Convergence: Within functional limits Visual Fields: No apparent deficits Additional Comments: right eye lid droop. Eye not blinking. Can get right eye lid shut all the way if he closes eyes really tight.            Pertinent Vitals/Pain Pain Assessment: No/denies pain(at rest in right shoulder, but the more he was up and about reports a dull ache in top of shoulder (already set up with ortho consult pta))     Hand Dominance Right(using L due to R shld pain and all the IVs in R UE)   Extremity/Trunk Assessment Upper Extremity Assessment Upper Extremity Assessment: Overall WFL for tasks assessed(Was having pain premorbidly in right shoulder (has appointment to follow up with orthopedist))   Lower Extremity Assessment Lower Extremity Assessment: Overall WFL for tasks assessed   Cervical / Trunk Assessment Cervical /  Trunk Assessment: Normal   Communication Communication Communication: No difficulties(does have R facial droop)   Cognition Arousal/Alertness: Awake/alert Behavior During Therapy: WFL for tasks assessed/performed Overall Cognitive Status: Within Functional Limits for tasks assessed                                     General Comments  BP known issue, RN provided medication. 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      Prior Functioning/Environment Level of Independence: Independent        Comments: works at [employer] practitioner goals can be found in the care plan section) Acute Rehab OT Goals Patient Stated Goal: to figure out if I've had a stroke or Bell's palsy  OT Frequency:                AM-PAC OT "6 Clicks" Daily Activity     Outcome Measure Help from another person eating meals?: None Help from another person taking care of personal grooming?: None Help from another person toileting, which includes using toliet, bedpan, or urinal?: None Help from another person bathing (including washing, rinsing, drying)?: None Help from another person to put on and taking off regular upper body clothing?: None Help from another person to put on and taking off regular lower body clothing?: None 6 Click Score: 24   End of Session Nurse Communication: (pt would benefit from eye drops for right eye due to decreased blinking and eye closeer)  Activity Tolerance: Patient tolerated treatment well Patient left: (with PT ambulating around unit)  OT Visit Diagnosis: Muscle weakness (generalized) (M62.81);Other (comment)(face)                Time: 5436-0677 OT Time Calculation (min): 19 min Charges:  OT General Charges $OT Visit: 1 Visit OT Evaluation $OT Eval Low Complexity: 1 Low  [address], OTR/L Acute [employer] Pager [phone] Office [phone]     Almon Register 02/08/2020, 9:31 AM

## 2020-02-08 NOTE — Progress Notes (Signed)
STROKE TEAM PROGRESS NOTE   INTERVAL HISTORY He presented with sudden onset of right facial weakness and denies accompanying ear pain, headache or other focal symptoms but apparently EMS noted some left upper extremity drift.  He was seen by telemetry neurology and given IV TPA.  Patient states his right facial weakness is unchanged.  He denies any other symptoms.  He has no known prior history of strokes or TIAs.  MRI scan of the brain has been performed and shows no acute infarct.  There is enhancement of the right facial nerve favoring Bell's palsy.  LDL cholesterol is 65 mg percent and hemoglobin A1c 7.2 blood pressure is adequately controlled.  Vitals:   02/08/20 0500 02/08/20 0600 02/08/20 0700 02/08/20 0800  BP: (!) 143/90 (!) 142/96 (!) 152/78   Pulse: 61 65 64   Resp: 14 (!) 23    Temp:    98.6 F (37 C)  TempSrc:      SpO2: 95% 96% 96%   Weight:      Height:        CBC:  Recent Labs  Lab 02/07/20 1130 02/07/20 1635  WBC 7.9 8.6  NEUTROABS 4.8  --   HGB 13.8 13.4  HCT 42.8 41.6  MCV 91.1 90.4  PLT 278 510    Basic Metabolic Panel:  Recent Labs  Lab 02/07/20 1130  NA 140  K 3.5  CL 105  CO2 24  GLUCOSE 141*  BUN 15  CREATININE 0.97  CALCIUM 8.9   Lipid Panel:     Component Value Date/Time   CHOL 117 02/08/2020 0628   TRIG 77 02/08/2020 0628   HDL 37 (L) 02/08/2020 0628   CHOLHDL 3.2 02/08/2020 0628   VLDL 15 02/08/2020 0628   LDLCALC 65 02/08/2020 0628   HgbA1c:  Lab Results  Component Value Date   HGBA1C 7.2 (H) 02/08/2020   Urine Drug Screen:     Component Value Date/Time   LABOPIA NONE DETECTED 02/07/2020 1733   COCAINSCRNUR NONE DETECTED 02/07/2020 1733   LABBENZ NONE DETECTED 02/07/2020 1733   AMPHETMU NONE DETECTED 02/07/2020 1733   THCU NONE DETECTED 02/07/2020 1733   LABBARB NONE DETECTED 02/07/2020 1733    Alcohol Level     Component Value Date/Time   ETH <10 02/07/2020 1130    IMAGING past 24 hours CT HEAD CODE STROKE WO  CONTRAST  Result Date: 02/07/2020 CLINICAL DATA:  Code stroke.  Right facial droop and slurred speech. EXAM: CT HEAD WITHOUT CONTRAST TECHNIQUE: Contiguous axial images were obtained from the base of the skull through the vertex without intravenous contrast. COMPARISON:  None. FINDINGS: Brain: Normal appearance without evidence of old or acute infarction, mass lesion, hemorrhage, hydrocephalus or extra-axial collection. Vascular: No abnormal vascular finding. Skull: Normal Sinuses/Orbits: Clear/normal Other: None ASPECTS ([place] Stroke Program Early CT Score) - Ganglionic level infarction (caudate, lentiform nuclei, internal capsule, insula, M1-M3 cortex): 7 - Supraganglionic infarction (M4-M6 cortex): 3 Total score (0-10 with 10 being normal): 10 IMPRESSION: 1. Normal head CT 2. ASPECTS is 10 3. These results were called by telephone at the time of interpretation on 02/07/2020 at 11:29 am to provider JOSHUA LONG , who verbally acknowledged these results. Electronically Signed   By: Nelson Chimes M.D.   On: 02/07/2020 11:29   CT ANGIO HEAD CODE STROKE  Result Date: 02/07/2020 CLINICAL DATA:  Right facial droop and slurred speech. EXAM: CT ANGIOGRAPHY HEAD AND NECK TECHNIQUE: Multidetector CT imaging of the head and neck was performed  using the standard protocol during bolus administration of intravenous contrast. Multiplanar CT image reconstructions and MIPs were obtained to evaluate the vascular anatomy. Carotid stenosis measurements (when applicable) are obtained utilizing NASCET criteria, using the distal internal carotid diameter as the denominator. CONTRAST:  18mL OMNIPAQUE IOHEXOL 350 MG/ML SOLN COMPARISON:  Head CT same day. FINDINGS: CTA NECK FINDINGS Aortic arch: Normal Right carotid system: Minimal atherosclerotic plaque at the ICA bulb. No stenosis or irregularity. Left carotid system: Normal Vertebral arteries: Normal Skeleton: Minimal mid cervical spondylosis. Other neck: No lymphadenopathy. 13 mm  thyroid nodule posteriorly on the right. 15 mm thyroid nodule on the left. Upper chest: Normal Review of the MIP images confirms the above findings CTA HEAD FINDINGS Anterior circulation: Both internal carotid arteries widely patent through the skull base and siphon regions. No stenosis. The anterior and middle cerebral vessels are normal. No large or medium vessel occlusion. No stenosis or aneurysm. Posterior circulation: Both vertebral arteries widely patent to the basilar. No basilar stenosis. Posterior circulation branch vessels are normal. Venous sinuses: Patent and normal. Anatomic variants: None significant. Review of the MIP images confirms the above findings IMPRESSION: Normal CT angiography of the neck and intracranial vessels. Thyroid nodules as above. Recommend thyroid US (ref: J Am Coll Radiol. 2015 Feb;12(2): 143-50). Electronically Signed   By: Nelson Chimes M.D.   On: 02/07/2020 12:23   CT ANGIO NECK CODE STROKE  Result Date: 02/07/2020 CLINICAL DATA:  Right facial droop and slurred speech. EXAM: CT ANGIOGRAPHY HEAD AND NECK TECHNIQUE: Multidetector CT imaging of the head and neck was performed using the standard protocol during bolus administration of intravenous contrast. Multiplanar CT image reconstructions and MIPs were obtained to evaluate the vascular anatomy. Carotid stenosis measurements (when applicable) are obtained utilizing NASCET criteria, using the distal internal carotid diameter as the denominator. CONTRAST:  5mL OMNIPAQUE IOHEXOL 350 MG/ML SOLN COMPARISON:  Head CT same day. FINDINGS: CTA NECK FINDINGS Aortic arch: Normal Right carotid system: Minimal atherosclerotic plaque at the ICA bulb. No stenosis or irregularity. Left carotid system: Normal Vertebral arteries: Normal Skeleton: Minimal mid cervical spondylosis. Other neck: No lymphadenopathy. 13 mm thyroid nodule posteriorly on the right. 15 mm thyroid nodule on the left. Upper chest: Normal Review of the MIP images confirms  the above findings CTA HEAD FINDINGS Anterior circulation: Both internal carotid arteries widely patent through the skull base and siphon regions. No stenosis. The anterior and middle cerebral vessels are normal. No large or medium vessel occlusion. No stenosis or aneurysm. Posterior circulation: Both vertebral arteries widely patent to the basilar. No basilar stenosis. Posterior circulation branch vessels are normal. Venous sinuses: Patent and normal. Anatomic variants: None significant. Review of the MIP images confirms the above findings IMPRESSION: Normal CT angiography of the neck and intracranial vessels. Thyroid nodules as above. Recommend thyroid US (ref: J Am Coll Radiol. 2015 Feb;12(2): 143-50). Electronically Signed   By: Nelson Chimes M.D.   On: 02/07/2020 12:23    PHYSICAL EXAM Pleasant middle-age male not in distress. . Afebrile. Head is nontraumatic. Neck is supple without bruit.    Cardiac exam no murmur or gallop. Lungs are clear to auscultation. Distal pulses are well felt. Neurological Exam ;  Awake  Alert oriented x 3. Normal speech and language.eye movements full without nystagmus.fundi were not visualized. Vision acuity and fields appear normal. Hearing is normal. Palatal movements are normal. Face asymmetric with lower half of the face greater than upper half of the face weakness on the right  with mild weakness of frowning and eye closure but severe weakness of right lower face.  Taste appears preserved bilaterally.  He denies any hyperacusis.. Tongue midline. Normal strength, tone, reflexes and coordination. Normal sensation. Gait deferred. ASSESSMENT/PLAN Jaime Thompson is a 54 y.o. male with history of HTN, HLD, DB, OSA presenting to [facility] with R sided facial numbness, R upper and lower facial weakness with teleneurology administering tPA 02/07/2020 at 1152 and transferred to [facility] for post tPA care.   R Bell's Palsy Stroke-like symptoms s/p  tPA  Code Stroke CT head No acute abnormality. ASPECTS 10.     CTA head & neck Unremarkable vasculature. Thyroid nodules w/ [place] recommended.   MRI w/w/o  Contrast enhancement at area R facial nerve. Possible pituitary adenoma. Few scattered T2 hyperintensities cerebral white matter   2D Echo EF 60-65%. No source of embolus. PFO suspected. Bubble study recommended  LDL 65  HgbA1c 7.2  SCDs for VTE prophylaxis  No antithrombotic prior to admission, now on No antithrombotic. No indication for antithrombotic at d/c  Therapy recommendations:  No therapy needs   Disposition:  Return home  Prn artifical tears and Lacrilube at hs for OD ptosis  Valcyclovir 1000 tid x 7 days  Prednisone 60 daily x 7 days  Hypertensive Urgency  BP as high as 179/104 on arrival  Home meds:  Tenormin 72m diltiazem 120, telmisartan 40 BP goal per post tPA protocol x 24h following tPA administration . Resumed home meds . Long-term BP goal normotensive  Hyperlipidemia  Home meds:  lipitor 20, resumed in hospital  Will not give intensive statin as LDL < goal  LDL 65, goal < 70  Continue statin at discharge  Diabetes type II Uncontrolled  Home meds:  Metoprolol 1000 bid, glipizide 5  HgbA1c 7.2, goal < 7.0  CBGs Recent Labs    02/07/20 1132 02/07/20 2135 02/08/20 0754  GLUCAP 133* 135* 127*      SSI  Other Stroke Risk Factors  Obesity, Body mass index is 30.3 kg/m., recommend weight loss, diet and exercise as appropriate   Obstructive sleep apnea  Likely PFO per bubble  Other Active Problems  ED on sildenafil  Depression on lexapro  Thyroid nodules w/ [place] recommended. Ordered.     Hospital day # 1 Patient presented with strokelike symptoms mostly peripheral right 7th nerve palsy but felt to have strokelike symptoms by telemetry neurologist and given IV TPA uneventfully.  Continue close neurological monitoring and strict blood pressure control as per post TPA protocol.   Physical occupational and speech therapy consults.  Mobilize out of bed.  Course of Medrol dose pack and acyclovir for 10 days.  Artificial tears liberally and Lacri-Lube ointment at night.  Discussed with patient and wife and answered questions.This patient is critically ill and at significant risk of neurological worsening, death and care requires constant monitoring of vital signs, hemodynamics,respiratory and cardiac monitoring, extensive review of multiple databases, frequent neurological assessment, discussion with family, other specialists and medical decision making of high complexity.I have made any additions or clarifications directly to the above note.This critical care time does not reflect procedure time, or teaching time or supervisory time of PA/NP/Med Resident etc but could involve care discussion time.  I spent 30 minutes of neurocritical care time  in the care of  this patient.    Antony Contras, MD  To contact Stroke Continuity provider, please refer to [email or web address] After hours, contact General Neurology

## 2020-02-08 NOTE — Evaluation (Signed)
Clinical/Bedside Swallow Evaluation Patient Details  Name: Rocio Wolak MRN: [id number] Date of Birth: 1965/09/05  Today's Date: 02/08/2020 Time: SLP Start Time (ACUTE ONLY): 4742 SLP Stop Time (ACUTE ONLY): 1420 SLP Time Calculation (min) (ACUTE ONLY): 21 min  Past Medical History:  Past Medical History:  Diagnosis Date  . Cataract    no surgery as yet/pre diagnosed  . Diabetes mellitus without complication ([place])   . Hypertension   . Sleep apnea    Past Surgical History:  Past Surgical History:  Procedure Laterality Date  . bilateral inguinal hernia repair    . HERNIA REPAIR     HPI:  Pt is a 54 y.o. male who presented to the [facility] with right sided mouth numbness and facial droop. Pt was given tPA then transferred to [facility]. CT negative; MRI indicates Bell's palsy. PMH includes sleep apnea, HTN, DM, cataract   Assessment / Plan / Recommendation Clinical Impression  Pt's cognition appears to be [facility] and his language is fluent with good comprehension. He has significant right-sided weakness (CN VII) but he believes his sensation has returned. His speech is mildly dysarthric but fully intelligible at the conversational level. We reviewed strategies for speech as well as compensatory strategies for oral preparation during swallowing. Will defer any additional follow up to OP SLP.    SLP Visit Diagnosis: Dysarthria and anarthria (R47.1)    Aspiration Risk       Diet Recommendation          Other  Recommendations     Follow up Recommendations Outpatient SLP      Frequency and Duration            Prognosis        Swallow Study   General HPI: Pt is a 54 y.o. male who presented to the [facility] ED with right sided mouth numbness and facial droop. Pt was given tPA then transferred to [facility]. CT negative; MRI indicates Bell's palsy. PMH includes sleep apnea, HTN, DM, cataract    Oral/Motor/Sensory Function Overall Oral Motor/Sensory Function: Moderate impairment Facial ROM: Reduced  right;Suspected CN VII (facial) dysfunction Facial Symmetry: Abnormal symmetry right;Suspected CN VII (facial) dysfunction Facial Strength: Reduced right;Suspected CN VII (facial) dysfunction Facial Sensation: Within Functional Limits Lingual ROM: Within Functional Limits Lingual Symmetry: Within Functional Limits Lingual Strength: Within Functional Limits Velum: Within Functional Limits Mandible: Within Functional Limits   Ice Chips     Thin Liquid      Nectar Thick     Honey Thick     Puree     Solid             Osie Bond., M.A. [place] Pager [phone] Office [phone]  02/08/2020,2:33 PM

## 2020-02-08 NOTE — Evaluation (Signed)
Physical Therapy Evaluation and DISCHARGE Patient Details Name: Jaime Thompson MRN: [id number] DOB: Apr 19, 1966 Today's Date: 02/08/2020   History of Present Illness  Jaime Thompson is an 54 y.o. male who initially presented to the [facility] with right sided mouth numbness starting at 10 AM while he was at work. The numbness went away but the patient then noticed right sided facial droop. With this, he also noted some dysarthria and liquid/food escaping from the right side of his mouth when eating/drinking. Jaime was given tPA then transferred to [facility]. CT negative, awaiting MRI. CVA vs Bells Palsy.    Clinical Impression  Jaime admitted with above. Aside from R facial droop Jaime functioning at his baseline. Jaime indep with transfers and ambulation. Jaime scored 24/24 on DGI indicating minimal falls risk. Jaime with no further need for acute skilled Jaime services at this time. Jaime SIGNING OFF. Please reconsult if needed in future.    Follow Up Recommendations No Jaime follow up    Equipment Recommendations  None recommended by Jaime    Recommendations for Other Services       Precautions / Restrictions Precautions Precautions: None Restrictions Weight Bearing Restrictions: No      Mobility  Bed Mobility Overal bed mobility: Modified Independent             General bed mobility comments: HOB Elevated  Transfers Overall transfer level: Independent Equipment used: None Transfers: Sit to/from Stand Sit to Stand: Independent         General transfer comment: Jaime holding his own tele box  Ambulation/Gait Ambulation/Gait assistance: Independent Gait Distance (Feet): 450 Feet Assistive device: None Gait Pattern/deviations: WFL(Within Functional Limits) Gait velocity: wfl Gait velocity interpretation: >4.37 ft/sec, indicative of normal walking speed General Gait Details: no episodes of LOB, steady, fluid gait pattern  Stairs Stairs: Yes Stairs assistance: Modified independent (Device/Increase  time) Stair Management: One rail Left;Alternating pattern Number of Stairs: 12 General stair comments: no difficulty, did half with L HR and half without  Wheelchair Mobility    Modified Rankin (Stroke Patients Only) Modified Rankin (Stroke Patients Only) Pre-Morbid Rankin Score: No symptoms Modified Rankin: Slight disability     Balance Overall balance assessment: No apparent balance deficits (not formally assessed)                               Standardized Balance Assessment Standardized Balance Assessment : Dynamic Gait Index   Dynamic Gait Index Level Surface: Normal Change in Gait Speed: Normal Gait with Horizontal Head Turns: Normal Gait with Vertical Head Turns: Normal Gait and Pivot Turn: Normal Step Over Obstacle: Normal Step Around Obstacles: Normal Steps: Normal Total Score: 24       Pertinent Vitals/Pain Pain Assessment: No/denies pain(at rest, with amb and mobility Jaime with R shld pain)    Home Living Family/patient expects to be discharged to:: Private residence Living Arrangements: Spouse/significant other Available Help at Discharge: Family;Available PRN/intermittently(wife works during the day) Type of Home: House Home Access: Stairs to enter   Technical brewer of Steps: 1 Home Layout: One level Home Equipment: None Additional Comments: 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    Prior Function Level of Independence: Independent         Comments: works at [employer]: Right(using L due to R shld pain and all the IVs in R UE)    Extremity/Trunk  Assessment   Upper Extremity Assessment Upper Extremity Assessment: Defer to OT evaluation    Lower Extremity Assessment Lower Extremity Assessment: Overall WFL for tasks assessed    Cervical / Trunk Assessment Cervical / Trunk Assessment: Normal  Communication   Communication: No difficulties(does have R  facial droop)  Cognition Arousal/Alertness: Awake/alert Behavior During Therapy: WFL for tasks assessed/performed Overall Cognitive Status: Within Functional Limits for tasks assessed                                        General Comments General comments (skin integrity, edema, etc.): BP with known issues, RN provided medicine to address, BP 117/96 at beginning of session    Exercises     Assessment/Plan    Jaime Assessment Patent does not need any further Jaime services  Jaime Problem List         Jaime Treatment Interventions      Jaime Goals (Current goals can be found in the Care Plan section)  Acute Rehab Jaime Goals Patient Stated Goal: to figure out if I've had a stroke or Bell's palsy Jaime Goal Formulation: All assessment and education complete, DC therapy    Frequency     Barriers to discharge        Co-evaluation               AM-PAC Jaime "6 Clicks" Mobility  Outcome Measure Help needed turning from your back to your side while in a flat bed without using bedrails?: None Help needed moving from lying on your back to sitting on the side of a flat bed without using bedrails?: None Help needed moving to and from a bed to a chair (including a wheelchair)?: None Help needed standing up from a chair using your arms (e.g., wheelchair or bedside chair)?: None Help needed to walk in hospital room?: None Help needed climbing 3-5 steps with a railing? : A Little 6 Click Score: 23    End of Session Equipment Utilized During Treatment: Gait belt Activity Tolerance: Patient tolerated treatment well Patient left: in chair;with call bell/phone within reach;with family/visitor present Nurse Communication: Mobility status Jaime Thompson ([id number])    Time: [id number] Jaime Time Calculation (min) (ACUTE ONLY): 18 min   Charges:   Jaime Evaluation $Jaime Eval Low Complexity: 1 Low          Kittie Plater, Jaime,  DPT Acute Rehabilitation Services Pager #: [phone] Office #: [phone]   [place] Lopes 02/08/2020, 10:06 AM

## 2020-02-08 NOTE — Progress Notes (Signed)
SLP Cancellation Note  Patient Details Name: Jaime Thompson MRN: [id number] DOB: [birth year]   Cancelled treatment:       Reason Eval/Treat Not Completed: Patient at procedure or test/unavailable (MRI). Will f.u for speech/language evaluation.    Osie Bond., M.A. [place] Acute Rehabilitation Services Pager [phone] Office [phone]  02/08/2020, 11:35 AM

## 2020-02-08 NOTE — Discharge Summary (Addendum)
Stroke Discharge Summary  Patient ID: Jaime Thompson   MRN: [id number]      DOB: 18-Aug-1966  Date of Admission: 02/07/2020 Date of Discharge: 02/08/2020  Attending Physician:  Garvin Fila, MD, Stroke MD Consultant(s):     None  Patient's PCP:  Yvone Neu, MD  DISCHARGE DIAGNOSIS:  Principal Problem:   Bell's palsy Active Problems:   Stroke-like symptoms s/p tPA   Essential hypertension   Hyperlipidemia   Diabetes mellitus, type II ([place])   Thyroid nodule   Allergies as of 02/08/2020   No Known Allergies      Medication List     TAKE these medications    acetaminophen 500 MG tablet Commonly known as: TYLENOL Take 1,000 mg by mouth every 6 (six) hours as needed for headache (pain).   artificial tears Oint ophthalmic ointment Commonly known as: LACRILUBE Place into the right eye at bedtime.   atenolol 25 MG tablet Commonly known as: TENORMIN Take 75 mg by mouth daily.   atorvastatin 20 MG tablet Commonly known as: LIPITOR Take 20 mg by mouth daily.   Cartia XT 120 MG 24 hr capsule Generic drug: diltiazem Take 120 mg by mouth daily.   cyclobenzaprine 10 MG tablet Commonly known as: FLEXERIL Take 1 tablet (10 mg total) by mouth 2 (two) times daily as needed for muscle spasms.   escitalopram 10 MG tablet Commonly known as: LEXAPRO Take 10 mg by mouth daily.   glipiZIDE 5 MG 24 hr tablet Commonly known as: GLUCOTROL XL Take 5 mg by mouth daily.   metFORMIN 1000 MG tablet Commonly known as: GLUCOPHAGE Take 1 tablet (1,000 mg total) by mouth 2 (two) times daily. Start taking on: February 10, 2020 What changed: These instructions start on February 10, 2020. If you are unsure what to do until then, ask your doctor or other care provider.   montelukast 10 MG tablet Commonly known as: SINGULAIR Take 10 mg by mouth daily as needed (seasonal allergies).   ondansetron 8 MG disintegrating tablet Commonly known as: ZOFRAN-ODT Take 8 mg by mouth every 8  (eight) hours as needed for nausea or vomiting.   polyvinyl alcohol 1.4 % ophthalmic solution Commonly known as: LIQUIFILM TEARS Place 1 drop into the right eye as needed for dry eyes.   predniSONE 20 MG tablet Commonly known as: DELTASONE Take 3 tablets (60 mg total) by mouth daily with breakfast.   sildenafil 20 MG tablet Commonly known as: REVATIO Take 40 mg by mouth daily as needed (erectile dysfunction).   telmisartan 40 MG tablet Commonly known as: MICARDIS Take 40 mg by mouth daily.   Testosterone 20.25 MG/ACT (1.62%) Gel Apply 20.25 mg topically See admin instructions. Apply 1 pump (20.25 mg) topically to each shoulder once daily   valACYclovir 1000 MG tablet Commonly known as: VALTREX Take 1 tablet (1,000 mg total) by mouth 3 (three) times daily.        LABORATORY STUDIES CBC    Component Value Date/Time   WBC 8.6 02/07/2020 1635   RBC 4.60 02/07/2020 1635   HGB 13.4 02/07/2020 1635   HCT 41.6 02/07/2020 1635   PLT 264 02/07/2020 1635   MCV 90.4 02/07/2020 1635   MCH 29.1 02/07/2020 1635   MCHC 32.2 02/07/2020 1635   RDW 13.5 02/07/2020 1635   LYMPHSABS 2.2 02/07/2020 1130   MONOABS 0.6 02/07/2020 1130   EOSABS 0.2 02/07/2020 1130   BASOSABS 0.0 02/07/2020 1130   CMP  Component Value Date/Time   NA 140 02/07/2020 1130   K 3.5 02/07/2020 1130   CL 105 02/07/2020 1130   CO2 24 02/07/2020 1130   GLUCOSE 141 (H) 02/07/2020 1130   BUN 15 02/07/2020 1130   CREATININE 0.97 02/07/2020 1130   CALCIUM 8.9 02/07/2020 1130   PROT 7.4 02/07/2020 1130   ALBUMIN 4.3 02/07/2020 1130   AST 24 02/07/2020 1130   ALT 40 02/07/2020 1130   ALKPHOS 64 02/07/2020 1130   BILITOT 0.6 02/07/2020 1130   GFRNONAA >60 02/07/2020 1130   GFRAA >60 02/07/2020 1130   COAGS Lab Results  Component Value Date   INR 1.1 02/07/2020   INR 1.0 02/07/2020   Lipid Panel    Component Value Date/Time   CHOL 117 02/08/2020 0628   TRIG 77 02/08/2020 0628   HDL 37 (L)  02/08/2020 0628   CHOLHDL 3.2 02/08/2020 0628   VLDL 15 02/08/2020 0628   LDLCALC 65 02/08/2020 0628   HgbA1C  Lab Results  Component Value Date   HGBA1C 7.2 (H) 02/08/2020   Urinalysis    Component Value Date/Time   COLORURINE YELLOW 02/07/2020 1733   APPEARANCEUR CLEAR 02/07/2020 1733   LABSPEC >1.046 (H) 02/07/2020 1733   PHURINE 6.0 02/07/2020 1733   GLUCOSEU 50 (A) 02/07/2020 1733   HGBUR NEGATIVE 02/07/2020 1733   BILIRUBINUR NEGATIVE 02/07/2020 1733   KETONESUR NEGATIVE 02/07/2020 1733   PROTEINUR NEGATIVE 02/07/2020 1733   NITRITE NEGATIVE 02/07/2020 1733   LEUKOCYTESUR NEGATIVE 02/07/2020 1733   Urine Drug Screen     Component Value Date/Time   LABOPIA NONE DETECTED 02/07/2020 1733   COCAINSCRNUR NONE DETECTED 02/07/2020 1733   LABBENZ NONE DETECTED 02/07/2020 1733   AMPHETMU NONE DETECTED 02/07/2020 1733   THCU NONE DETECTED 02/07/2020 1733   LABBARB NONE DETECTED 02/07/2020 1733    Alcohol Level    Component Value Date/Time   ETH <10 02/07/2020 1130     SIGNIFICANT DIAGNOSTIC STUDIES MR BRAIN W WO CONTRAST  Result Date: 02/08/2020 CLINICAL DATA:  Stroke follow-up. Question meningioma versus schwannoma. EXAM: MRI HEAD WITHOUT AND WITH CONTRAST TECHNIQUE: Multiplanar, multiecho pulse sequences of the brain and surrounding structures were obtained without and with intravenous contrast. CONTRAST:  12mL GADAVIST GADOBUTROL 1 MMOL/ML IV SOLN COMPARISON:  Head CT February 07, 2019 FINDINGS: Brain: No acute infarction, hemorrhage, hydrocephalus or extra-axial collection. Few scattered foci of T2 hyperintensity are seen within the white matter of the cerebral hemispheres, nonspecific. 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 Subtle contrast enhancement is seen within the right internal auditory canal fundus and at the labyrinthine segment of the right facial nerve (series 15, image 9, series 16, image 9). No mass lesion seen within the  cerebellopontine angle cisterns. An 8 mm T2 hyperintense focus is noted on the left side of the pituitary gland (series 14, image 18), may represent pituitary adenoma. Vascular: Normal flow voids. Skull and upper cervical spine: Normal marrow signal. Sinuses/Orbits: Minimal mucosal thickening of the bilateral ethmoid cells. The orbits are maintained. Other: None. IMPRESSION: 1. Subtle contrast enhancement within the right internal auditory canal fundus and at the labyrinthine segment of the right facial nerve. Findings may reflect Bell's palsy. 2. An 8 mm T2 hyperintense focus on the left side of the pituitary gland, may represent pituitary adenoma. 3. Few scattered foci of T2 hyperintensity within the white matter of the cerebral hemispheres, nonspecific. Differential considerations include chronic small vessel ischemic disease, sequela of prior infectious/inflammatory process, migraine  headaches or demyelination, among others. Electronically Signed   By: Pedro Earls M.D.   On: 02/08/2020 13:59   ECHOCARDIOGRAM COMPLETE  Result Date: 02/08/2020    ECHOCARDIOGRAM REPORT   Patient Name:   Jaime Thompson Date of Exam: 02/08/2020 Medical Rec #:  [id number]   Height:       68.0 in Accession #:    [id number]  Weight:       199.3 lb Date of Birth:  17-Oct-1965  BSA:          2.041 m Patient Age:    64 years    BP:           153/89 mmHg Patient Gender: M           HR:           54 bpm. Exam Location:  Inpatient Procedure: 2D Echo, Cardiac Doppler and Color Doppler Indications:    Stroke 434.91 / I163.9  History:        Patient has no prior history of Echocardiogram examinations.                 Risk Factors:Hypertension, Diabetes and Sleep Apnea.  Sonographer:    Jonelle Sidle Dance Referring Phys: [place]  1. Left ventricular ejection fraction, by estimation, is 60 to 65%. The left ventricle has normal function. The left ventricle has no regional wall motion abnormalities. Left ventricular  diastolic parameters are consistent with Grade I diastolic dysfunction (impaired relaxation).  2. Right ventricular systolic function is normal. The right ventricular size is normal.  3. Left atrial size was mildly dilated.  4. The mitral valve is grossly normal. Trivial mitral valve regurgitation.  5. The aortic valve is tricuspid. Aortic valve regurgitation is trivial. Mild aortic valve sclerosis is present, with no evidence of aortic valve stenosis.  6. The inferior vena cava is normal in size with greater than 50% respiratory variability, suggesting right atrial pressure of 3 mmHg.  7. PFO suspected by color doppler, recommend bubble study. Conclusion(s)/Recommendation(s): Cannot exclude ASD/PFO. Consider transesophageal echocardiogram, if clinically indicated or limited bubble study. FINDINGS  Left Ventricle: Left ventricular ejection fraction, by estimation, is 60 to 65%. The left ventricle has normal function. The left ventricle has no regional wall motion abnormalities. The left ventricular internal cavity size was normal in size. There is  borderline left ventricular hypertrophy. Left ventricular diastolic parameters are consistent with Grade I diastolic dysfunction (impaired relaxation). Indeterminate filling pressures. Right Ventricle: The right ventricular size is normal. No increase in right ventricular wall thickness. Right ventricular systolic function is normal. Left Atrium: Left atrial size was mildly dilated. Right Atrium: Right atrial size was normal in size. Pericardium: There is no evidence of pericardial effusion. Mitral Valve: The mitral valve is grossly normal. Trivial mitral valve regurgitation. Tricuspid Valve: The tricuspid valve is grossly normal. Tricuspid valve regurgitation is not demonstrated. Aortic Valve: The aortic valve is tricuspid. Aortic valve regurgitation is trivial. Mild aortic valve sclerosis is present, with no evidence of aortic valve stenosis. Pulmonic Valve: The  pulmonic valve was grossly normal. Pulmonic valve regurgitation is not visualized. Aorta: The aortic root and ascending aorta are structurally normal, with no evidence of dilitation. Venous: The inferior vena cava is normal in size with greater than 50% respiratory variability, suggesting right atrial pressure of 3 mmHg. IAS/Shunts: PFO suspected by color doppler, recommend bubble study.  LEFT VENTRICLE PLAX 2D LVIDd:         5.33 cm  Diastology LVIDs:         3.65 cm  LV e' lateral:   8.05 cm/s LV PW:         1.19 cm  LV E/e' lateral: 9.3 LV IVS:        0.89 cm  LV e' medial:    7.40 cm/s LVOT diam:     2.20 cm  LV E/e' medial:  10.2 LV SV:         109 LV SV Index:   53 LVOT Area:     3.80 cm  RIGHT VENTRICLE             IVC RV Basal diam:  3.21 cm     IVC diam: 1.68 cm RV Mid diam:    2.06 cm RV S prime:     11.40 cm/s TAPSE (M-mode): 2.2 cm LEFT ATRIUM             Index       RIGHT ATRIUM           Index LA diam:        4.80 cm 2.35 cm/m  RA Area:     19.90 cm LA Vol (A2C):   72.7 ml 35.63 ml/m RA Volume:   55.50 ml  27.20 ml/m LA Vol (A4C):   76.4 ml 37.44 ml/m LA Biplane Vol: 77.6 ml 38.03 ml/m  AORTIC VALVE LVOT Vmax:   129.00 cm/s LVOT Vmean:  76.100 cm/s LVOT VTI:    0.286 m  AORTA Ao Root diam: 3.40 cm Ao Asc diam:  3.60 cm MITRAL VALVE MV Area (PHT): 3.68 cm    SHUNTS MV Decel Time: 206 msec    Systemic VTI:  0.29 m MV E velocity: 75.20 cm/s  Systemic Diam: 2.20 cm MV A velocity: 58.20 cm/s MV E/A ratio:  1.29 Lyman Bishop MD Electronically signed by Lyman Bishop MD Signature Date/Time: 02/08/2020/11:49:31 AM    Final    CT HEAD CODE STROKE WO CONTRAST  Result Date: 02/07/2020 CLINICAL DATA:  Code stroke.  Right facial droop and slurred speech. EXAM: CT HEAD WITHOUT CONTRAST TECHNIQUE: Contiguous axial images were obtained from the base of the skull through the vertex without intravenous contrast. COMPARISON:  None. FINDINGS: Brain: Normal appearance without evidence of old or acute infarction,  mass lesion, hemorrhage, hydrocephalus or extra-axial collection. Vascular: No abnormal vascular finding. Skull: Normal Sinuses/Orbits: Clear/normal Other: None ASPECTS ([place] Stroke Program Early CT Score) - Ganglionic level infarction (caudate, lentiform nuclei, internal capsule, insula, M1-M3 cortex): 7 - Supraganglionic infarction (M4-M6 cortex): 3 Total score (0-10 with 10 being normal): 10 IMPRESSION: 1. Normal head CT 2. ASPECTS is 10 3. These results were called by telephone at the time of interpretation on 02/07/2020 at 11:29 am to provider JOSHUA LONG , who verbally acknowledged these results. Electronically Signed   By: Nelson Chimes M.D.   On: 02/07/2020 11:29   CT ANGIO HEAD CODE STROKE  Result Date: 02/07/2020 CLINICAL DATA:  Right facial droop and slurred speech. EXAM: CT ANGIOGRAPHY HEAD AND NECK TECHNIQUE: Multidetector CT imaging of the head and neck was performed using the standard protocol during bolus administration of intravenous contrast. Multiplanar CT image reconstructions and MIPs were obtained to evaluate the vascular anatomy. Carotid stenosis measurements (when applicable) are obtained utilizing NASCET criteria, using the distal internal carotid diameter as the denominator. CONTRAST:  2mL OMNIPAQUE IOHEXOL 350 MG/ML SOLN COMPARISON:  Head CT same day. FINDINGS: CTA NECK FINDINGS Aortic arch: Normal Right carotid system:  Minimal atherosclerotic plaque at the ICA bulb. No stenosis or irregularity. Left carotid system: Normal Vertebral arteries: Normal Skeleton: Minimal mid cervical spondylosis. Other neck: No lymphadenopathy. 13 mm thyroid nodule posteriorly on the right. 15 mm thyroid nodule on the left. Upper chest: Normal Review of the MIP images confirms the above findings CTA HEAD FINDINGS Anterior circulation: Both internal carotid arteries widely patent through the skull base and siphon regions. No stenosis. The anterior and middle cerebral vessels are normal. No large or medium  vessel occlusion. No stenosis or aneurysm. Posterior circulation: Both vertebral arteries widely patent to the basilar. No basilar stenosis. Posterior circulation branch vessels are normal. Venous sinuses: Patent and normal. Anatomic variants: None significant. Review of the MIP images confirms the above findings IMPRESSION: Normal CT angiography of the neck and intracranial vessels. Thyroid nodules as above. Recommend thyroid US (ref: J Am Coll Radiol. 2015 Feb;12(2): 143-50). Electronically Signed   By: Nelson Chimes M.D.   On: 02/07/2020 12:23   CT ANGIO NECK CODE STROKE  Result Date: 02/07/2020 CLINICAL DATA:  Right facial droop and slurred speech. EXAM: CT ANGIOGRAPHY HEAD AND NECK TECHNIQUE: Multidetector CT imaging of the head and neck was performed using the standard protocol during bolus administration of intravenous contrast. Multiplanar CT image reconstructions and MIPs were obtained to evaluate the vascular anatomy. Carotid stenosis measurements (when applicable) are obtained utilizing NASCET criteria, using the distal internal carotid diameter as the denominator. CONTRAST:  81mL OMNIPAQUE IOHEXOL 350 MG/ML SOLN COMPARISON:  Head CT same day. FINDINGS: CTA NECK FINDINGS Aortic arch: Normal Right carotid system: Minimal atherosclerotic plaque at the ICA bulb. No stenosis or irregularity. Left carotid system: Normal Vertebral arteries: Normal Skeleton: Minimal mid cervical spondylosis. Other neck: No lymphadenopathy. 13 mm thyroid nodule posteriorly on the right. 15 mm thyroid nodule on the left. Upper chest: Normal Review of the MIP images confirms the above findings CTA HEAD FINDINGS Anterior circulation: Both internal carotid arteries widely patent through the skull base and siphon regions. No stenosis. The anterior and middle cerebral vessels are normal. No large or medium vessel occlusion. No stenosis or aneurysm. Posterior circulation: Both vertebral arteries widely patent to the basilar. No  basilar stenosis. Posterior circulation branch vessels are normal. Venous sinuses: Patent and normal. Anatomic variants: None significant. Review of the MIP images confirms the above findings IMPRESSION: Normal CT angiography of the neck and intracranial vessels. Thyroid nodules as above. Recommend thyroid US (ref: J Am Coll Radiol. 2015 Feb;12(2): 143-50). Electronically Signed   By: Nelson Chimes M.D.   On: 02/07/2020 12:23      HISTORY OF PRESENT ILLNESS Jaime Thompson is an 54 y.o. male who presented to [facility] ED with right sided mouth numbness starting at 10 AM while he was at work. The numbness went away but the patient then noticed right sided facial droop. With this, he also noted some dysarthria and liquid/food escaping from the right side of his mouth when eating/drinking. CT head at [facility] ED was normal. Slight right upper extremity weakness was noticed by EDP on exam, which was thought possibly to be due to the ongoing pain in his right shoulder, but was also concerning for possible stroke given the right facial weakness and numbness. Teleneurology was consulted at [facility] and decision was made to administer IV tPA. He has had some right shoulder pain for which he was seen in the ED on 5/30. He denies any other symptoms, including no vision changes,  hearing loss, taste changes, confusion, aphasia, limb weakness, limb numbness, CP, palpitations or difficulty breathing. He was transferred to [facility] for post tPA care.    HOSPITAL COURSE [name] is a 54 y.o. male with history of HTN, HLD, DB, OSA presenting to [facility] with R sided facial numbness, R upper and lower facial weakness with teleneurology administering tPA 02/07/2020 at 1152 and transferred to [place] for post tPA care.    R Bell's Palsy Stroke-like symptoms s/p tPA Code Stroke CT head No acute abnormality. ASPECTS 10.    CTA head &  neck Unremarkable vasculature. Thyroid nodules w/ [place] recommended.  MRI w/w/o  Contrast enhancement at area R facial nerve. Possible pituitary adenoma. Few scattered T2 hyperintensities cerebral white matter  2D Echo EF 60-65%. No source of embolus. PFO suspected. Bubble study recommended LDL 65 HgbA1c 7.2 SCDs for VTE prophylaxis No antithrombotic prior to admission, now on No antithrombotic. No indication for antithrombotic at d/c Therapy recommendations:  No therapy needs  Disposition:  Return home Prn artifical tears and Lacrilube at hs for OD ptosis Valcyclovir 1000 tid x 7 days Prednisone 60 daily x 7 days   Hypertensive Urgency BP as high as 179/104 on arrival Home meds:  Tenormin 23m diltiazem 120, telmisartan 40 BP goal per post tPA protocol x 24h following tPA administration Resumed home meds Long-term BP goal normotensive   Hyperlipidemia Home meds:  lipitor 20, resumed in hospital LDL 65 Continue statin at discharge   Diabetes type II Uncontrolled Home meds:  Metoprolol 1000 bid, glipizide 5 HgbA1c 7.2   Other Stroke Risk Factors Obesity, Body mass index is 30.3 kg/m., recommend weight loss, diet and exercise as appropriate  Obstructive sleep apnea Likely PFO per bubble   Other Active Problems ED on sildenafil Depression on lexapro Thyroid nodules w/ [place] recommended - can do as an OP. Defer to PCP  DISCHARGE EXAM Blood pressure (!) 156/90, pulse 61, temperature 98.5 F (36.9 C), temperature source Oral, resp. rate 15, height 5\' 8"  (1.727 m), weight 90.4 kg, SpO2 96 %. Pleasant middle-age male not in distress. . Afebrile. Head is nontraumatic. Neck is supple without bruit.    Cardiac exam no murmur or gallop. Lungs are clear to auscultation. Distal pulses are well felt. Neurological Exam ;  Awake  Alert oriented x 3. Normal speech and language.eye movements full without nystagmus.fundi were not visualized. Vision acuity and fields appear normal. Hearing is  normal. Palatal movements are normal. Face asymmetric with lower half of the face greater than upper half of the face weakness on the right with mild weakness of frowning and eye closure but severe weakness of right lower face.  Taste appears preserved bilaterally.  He denies any hyperacusis.. Tongue midline. Normal strength, tone, reflexes and coordination. Normal sensation. Gait deferred. ASSESSMENT/PLAN [name] is a 55 y.o. male with history of HTN, HLD, DB, OSA presenting to [facility] with R sided facial numbness, R upper and lower facial weakness with teleneurology administering tPA 02/07/2020 at 1152 and transferred to [place] for post tPA care.   Discharge Diet   Carbohydrate modified thin liquids  DISCHARGE PLAN Disposition:  Return home Prednisone and valacyclovir for Bell's Follow-up PCP in 2 weeks (thyroid nodules - need outpatient ultrasound to evaluate) Follow-up [facility] in 4 weeks, office to schedule an appointment.   35 minutes were spent preparing discharge.  Burnetta Sabin, MSN,  APRN, ANVP-BC, AGPCNP-BC Advanced Practice Stroke Nurse [place] for Schedule & Pager information 02/08/2020 2:32 PM   I have personally obtained history,examined this patient, reviewed notes, independently viewed imaging studies, participated in medical decision making and plan of care.ROS completed by me personally and pertinent positives fully documented  I have made any additions or clarifications directly to the above note. Agree with note above.    Antony Contras, MD Medical Director [facility] Stroke Center Pager: [phone] 02/09/2020 5:14 PM

## 2020-02-08 NOTE — Progress Notes (Signed)
  Echocardiogram 2D Echocardiogram has been performed.  [name] 02/08/2020, 11:11 AM

## 2020-03-16 ENCOUNTER — Other Ambulatory Visit: Payer: Self-pay

## 2020-03-16 ENCOUNTER — Ambulatory Visit: Payer: BC Managed Care – PPO | Admitting: Neurology

## 2020-03-16 ENCOUNTER — Encounter: Payer: Self-pay | Admitting: Neurology

## 2020-03-16 VITALS — BP 178/98 | HR 57 | Ht 68.0 in | Wt 202.0 lb

## 2020-03-16 DIAGNOSIS — R2990 Unspecified symptoms and signs involving the nervous system: Secondary | ICD-10-CM

## 2020-03-16 DIAGNOSIS — R200 Anesthesia of skin: Secondary | ICD-10-CM | POA: Diagnosis not present

## 2020-03-16 DIAGNOSIS — G510 Bell's palsy: Secondary | ICD-10-CM

## 2020-03-16 DIAGNOSIS — D352 Benign neoplasm of pituitary gland: Secondary | ICD-10-CM | POA: Diagnosis not present

## 2020-03-16 NOTE — Patient Instructions (Signed)
I had a long discussion with the patient with his strokelike presentation and right-sided Bell's palsy from which he seems to be recovering well.  I recommend he continue facial muscle exercises and use artificial tears liberally in his right eye while awake.  Check Lyme titer given history of tick bite.  Check MRI scan of the brain with pituitary sections to look for adenoma as suggested on the previous MRI.  No routine scheduled follow-up appointment is necessary but he may return in the future if needed only.  Bell Palsy, Adult  Bell palsy is a short-term inability to move muscles in part of the face. The inability to move (paralysis) results from inflammation or compression of the facial nerve, which travels along the skull and under the ear to the side of the face (7th cranial nerve). This nerve is responsible for facial movements that include blinking, closing the eyes, smiling, and frowning. What are the causes? The exact cause of this condition is not known. It may be caused by an infection from a virus, such as the chickenpox (herpes zoster), Epstein-Barr, or mumps virus. What increases the risk? You are more likely to develop this condition if:  You are pregnant.  You have diabetes.  You have had a recent infection in your nose, throat, or airways (upper respiratory infection).  You have a weakened body defense system (immune system).  You have had a facial injury, such as a fracture.  You have a family history of Bell palsy. What are the signs or symptoms? Symptoms of this condition include:  Weakness on one side of the face.  Drooping eyelid and corner of the mouth.  Excessive tearing in one eye.  Difficulty closing the eyelid.  Dry eye.  Drooling.  Dry mouth.  Changes in taste.  Change in facial appearance.  Pain behind one ear.  Ringing in one or both ears.  Sensitivity to sound in one ear.  Facial twitching.  Headache.  Impaired  speech.  Dizziness.  Difficulty eating or drinking. Most of the time, only one side of the face is affected. Rarely, Bell palsy affects the whole face. How is this diagnosed? This condition is diagnosed based on:  Your symptoms.  Your medical history.  A physical exam. You may also have to see health care providers who specialize in disorders of the nerves (neurologist) or diseases and conditions of the eye (ophthalmologist). You may have tests, such as:  A test to check for nerve damage (electromyogram).  Imaging studies, such as CT or MRI scans.  Blood tests. How is this treated? This condition affects every person differently. Sometimes symptoms go away without treatment within a couple weeks. If treatment is needed, it varies from person to person. The goal of treatment is to reduce inflammation and protect the eye from damage. Treatment for Bell palsy may include:  Medicines, such as: ? Steroids to reduce swelling and inflammation. ? Antiviral drugs. ? Pain relievers, including aspirin, acetaminophen, or ibuprofen.  Eye drops or ointment to keep your eye moist.  Eye protection, if you cannot close your eye.  Exercises or massage to regain muscle strength and function (physical therapy). Follow these instructions at home:   Take over-the-counter and prescription medicines only as told by your health care provider.  If your eye is affected: ? Keep your eye moist with eye drops or ointment as told by your health care provider. ? Follow instructions for eye care and protection as told by your health care provider.  Do any physical therapy exercises as told by your health care provider.  Keep all follow-up visits as told by your health care provider. This is important. Contact a health care provider if:  You have a fever.  Your symptoms do not get better within 2-3 weeks, or your symptoms get worse.  Your eye is red, irritated, or painful.  You have new  symptoms. Get help right away if:  You have weakness or numbness in a part of your body other than your face.  You have trouble swallowing.  You develop neck pain or stiffness.  You develop dizziness or shortness of breath. Summary  Bell palsy is a short-term inability to move muscles in part of the face. The inability to move (paralysis) results from inflammation or compression of the facial nerve.  This condition affects every person differently. Sometimes symptoms go away without treatment within a couple weeks.  If treatment is needed, it varies from person to person. The goal of treatment is to reduce inflammation and protect the eye from damage.  Contact your health care provider if your symptoms do not get better within 2-3 weeks, or your symptoms get worse. This information is not intended to replace advice given to you by your health care provider. Make sure you discuss any questions you have with your health care provider. Document Revised: 08/02/2017 Document Reviewed: 10/23/2016 Elsevier Patient Education  2020 [employer].

## 2020-03-16 NOTE — Progress Notes (Signed)
[facility] [address] [place]. [place] [address] [phone]       OFFICE FOLLOW-UP NOTE  [name] Date of Birth:  February 26, 1966 Medical Record Number:  [id number]   HPI: [name] is a 54 year old Caucasian male seen today for initial office follow-up visit following hospital admission for stroke like episode in June 20 21.  He is accompanied by his wife.  History is obtained from them, review of electronic medical records and I personally reviewed imaging films in PACS.  He has past medical history of hypertension, diabetes, sleep apnea who presented to [facility] emergency room on 02/06/1930 with sudden onset right-sided numbness and facial droop.  He was also noticed some dysarthria and trouble drinking liquids with drooling from the right corner of the mouth.  He was seen by telemetry neurologist there who felt the patient could have a brainstem stroke and after discussing risk benefits give him IV TPA.  He was transferred to [facility] to the ICU for further evaluation.  CT scan of the head on admission was unremarkable.  Urine drug screen was negative.  CT angiogram of the brain and neck also showed no significant large vessel stenosis or occlusion.  Incidental thyroid nodules were noted.  MRI scan of the brain showed no acute infarct and postcontrast MRI showed enhancement of the right facial nerve and also raise possibility of a small pituitary adenoma.  2D echo showed normal ejection fraction with possibility of PFO but not definitive.  LDL cholesterol 65 mg percent.  Hemoglobin A1c was 7.2.  Patient was treated as Bell's palsy with artificial tears during the day and Lacri-Lube ointment at night as well as a 7-day course of valacyclovir and prednisone tapering.  Patient states is 1 done well since discharge.  Right facial weakness seems to be improving.  He has been doing facial exercises as instructed by speech therapist.  Right facial numbness is  gone.  He still has mild hyperacusis in the right ear but his taste is much better.  He has been putting artificial tears every 2 hourly in his right eye.  His complaints of increasing fatigue and sleepiness as well as daytime tiredness.  He wants to return to work soon.  He has had no new or worsening symptoms.  He does give a history of tick bite earlier this year and does have a dog which has ticks.  He has not been tested for Lyme's disease.  He has no prior history of Bell's palsy, ear infections, head injury or trauma.  ROS:   14 system review of systems is positive for facial weakness, hyperacusis, numbness, altered taste, decreased stamina, tiredness and all other systems negative PMH:  Past Medical History:  Diagnosis Date  . Cataract    no surgery as yet/pre diagnosed  . Diabetes mellitus without complication ([place])   . Hypertension   . Sleep apnea     Social History:  Social History   Socioeconomic History  . Marital status: Married    Spouse name: Not on file  . Number of children: Not on file  . Years of education: Not on file  . Highest education level: Not on file  Occupational History  . Not on file  Tobacco Use  . Smoking status: Never Smoker  . Smokeless tobacco: Never Used  Vaping Use  . Vaping Use: Never used  Substance and Sexual Activity  . Alcohol use: No  . Drug use: No  . Sexual  activity: Not on file  Other Topics Concern  . Not on file  Social History Narrative  . Not on file   Social Determinants of Health   Financial Resource Strain:   . Difficulty of Paying Living Expenses:   Food Insecurity:   . Worried About Charity fundraiser in the Last Year:   . Arboriculturist in the Last Year:   Transportation Needs:   . Film/video editor (Medical):   Marland Kitchen Lack of Transportation (Non-Medical):   Physical Activity:   . Days of Exercise per Week:   . Minutes of Exercise per Session:   Stress:   . Feeling of Stress :   Social Connections:   .  Frequency of Communication with Friends and Family:   . Frequency of Social Gatherings with Friends and Family:   . Attends Religious Services:   . Active Member of Clubs or Organizations:   . Attends Archivist Meetings:   Marland Kitchen Marital Status:   Intimate Partner Violence:   . Fear of Current or Ex-Partner:   . Emotionally Abused:   Marland Kitchen Physically Abused:   . Sexually Abused:     Medications:   Current Outpatient Medications on File Prior to Visit  Medication Sig Dispense Refill  . acetaminophen (TYLENOL) 500 MG tablet Take 1,000 mg by mouth every 6 (six) hours as needed for headache (pain).    Marland Kitchen artificial tears (LACRILUBE) OINT ophthalmic ointment Place into the right eye at bedtime. 1 Tube 2  . atenolol (TENORMIN) 25 MG tablet Take 75 mg by mouth daily.     Marland Kitchen atorvastatin (LIPITOR) 20 MG tablet Take 20 mg by mouth daily.    Marland Kitchen diltiazem (CARTIA XT) 120 MG 24 hr capsule Take 120 mg by mouth daily.    Marland Kitchen escitalopram (LEXAPRO) 10 MG tablet Take 10 mg by mouth daily.    Marland Kitchen glipiZIDE (GLUCOTROL XL) 5 MG 24 hr tablet Take 5 mg by mouth daily.    . metFORMIN (GLUCOPHAGE) 1000 MG tablet Take 1 tablet (1,000 mg total) by mouth 2 (two) times daily.    . montelukast (SINGULAIR) 10 MG tablet Take 10 mg by mouth daily as needed (seasonal allergies).     . ondansetron (ZOFRAN-ODT) 8 MG disintegrating tablet Take 8 mg by mouth every 8 (eight) hours as needed for nausea or vomiting.     . polyvinyl alcohol (LIQUIFILM TEARS) 1.4 % ophthalmic solution Place 1 drop into the right eye as needed for dry eyes. 15 mL 0  . sildenafil (REVATIO) 20 MG tablet Take 40 mg by mouth daily as needed (erectile dysfunction).     Marland Kitchen telmisartan (MICARDIS) 40 MG tablet Take 40 mg by mouth daily.     . Testosterone 20.25 MG/ACT (1.62%) GEL Apply 20.25 mg topically See admin instructions. Apply 1 pump (20.25 mg) topically to each shoulder once daily     No current facility-administered medications on file prior to  visit.    Allergies:  No Known Allergies  Physical Exam General: well developed, well nourished middle-aged Caucasian male, seated, in no evident distress Head: head normocephalic and atraumatic.  Neck: supple with no carotid or supraclavicular bruits Cardiovascular: regular rate and rhythm, no murmurs Musculoskeletal: no deformity Skin:  no rash/petichiae Vascular:  Normal pulses all extremities Vitals:   03/16/20 1357  BP: (!) 178/98  Pulse: (!) 57   Neurologic Exam Mental Status: Awake and fully alert. Oriented to place and time. Recent and remote memory intact.  Attention span, concentration and fund of knowledge appropriate. Mood and affect appropriate.  Cranial Nerves: Fundoscopic exam reveals sharp disc margins. Pupils equal, briskly reactive to light. Extraocular movements full without nystagmus. Visual fields full to confrontation. Hearing intact. Facial sensation intact.  Mild right peripheral pattern of facial nerve weakness with slight weakness of frowning, eye closure, nasolabial fold and cheek muscles only.  Tongue, palate moves normally and symmetrically.  Motor: Normal bulk and tone. Normal strength in all tested extremity muscles. Sensory.: intact to touch ,pinprick .position and vibratory sensation.  Coordination: Rapid alternating movements normal in all extremities. Finger-to-nose and heel-to-shin performed accurately bilaterally. Gait and Station: Arises from chair without difficulty. Stance is normal. Gait demonstrates normal stride length and balance . Able to heel, toe and tandem walk without difficulty.  Reflexes: 1+ and symmetric. Toes downgoing.       ASSESSMENT: 54 year old Caucasian male with sudden onset of right lower motor neuron pattern facial nerve weakness in June 2021 with strokelike episode presentation treated with TPA.  He likely had right Bell's palsy and is improving well.  MRI shows possible pituitary adenoma which needs  evaluation.     PLAN: I had a long discussion with the patient with his strokelike presentation and right-sided Bell's palsy from which he seems to be recovering well.  I recommend he continue facial muscle exercises and use artificial tears liberally in his right eye while awake.  Check Lyme titer given history of tick bite.  Check MRI scan of the brain with pituitary sections to look for adenoma as suggested on the previous MRI.  He may return to work without restriction starting next week and was given a note upon request for the same.  No routine scheduled follow-up appointment is necessary but he may return in the future if needed only. Greater than 50% of time during this 30 minute visit was spent on counseling,explanation of diagnosis of strokelike episode, Bell's palsy, pituitary adenoma, planning of further management, discussion with patient and family and coordination of care Antony Contras, MD   03/16/2020 2:32 PM  Note: This document was prepared with digital dictation and possible smart phrase technology. Any transcriptional errors that result from this process are unintentional

## 2020-03-17 LAB — B. BURGDORFI ANTIBODIES: Lyme IgG/IgM Ab: <0.91 {ISR} (ref 0.00–0.90)

## 2020-03-21 ENCOUNTER — Encounter: Payer: Self-pay | Admitting: *Deleted

## 2020-03-21 NOTE — Progress Notes (Signed)
Kindly inform the patient that Lyme antibody test was negative

## 2020-03-29 ENCOUNTER — Telehealth: Payer: Self-pay | Admitting: Neurology

## 2020-03-29 NOTE — Telephone Encounter (Signed)
[employer] Auth: [id number] (exp. 03/29/20 to 04/27/20) order sent to GI. They will reach out to the patient to schedule.

## 2020-05-11 ENCOUNTER — Other Ambulatory Visit: Payer: Self-pay

## 2020-05-11 NOTE — Patient Outreach (Signed)
[facility]) Care Management  05/11/2020  Jaime Thompson 1965-12-29 [id number]  First telephone outreach attempt to obtain mRS. No answer. Left message for returned call.  [facility] Management Assistant [phone]

## 2020-05-17 ENCOUNTER — Other Ambulatory Visit: Payer: Self-pay

## 2020-05-17 NOTE — Patient Outreach (Signed)
[facility]) Care Management  05/17/2020  Jaime Thompson 27-May-1966 [id number]   Second telephone outreach attempt to obtain mRS. No answer. Left message for returned call.  [facility] Management Assistant [phone]

## 2020-05-20 ENCOUNTER — Other Ambulatory Visit: Payer: Self-pay

## 2020-05-20 NOTE — Patient Outreach (Signed)
  [facility]) Care Management  05/20/2020  Jaime Thompson November 09, 1965 [id number]  3 outreach attempts were completed to obtain mRs. mRs could not be obtained because patient never returned my calls. mRs=7    [facility] Management Assistant

## 2020-06-29 ENCOUNTER — Other Ambulatory Visit: Payer: Self-pay | Admitting: Neurosurgery

## 2020-06-29 DIAGNOSIS — D497 Neoplasm of unspecified behavior of endocrine glands and other parts of nervous system: Secondary | ICD-10-CM

## 2020-07-25 ENCOUNTER — Other Ambulatory Visit: Payer: BC Managed Care – PPO

## 2020-07-26 ENCOUNTER — Ambulatory Visit
Admission: RE | Admit: 2020-07-26 | Discharge: 2020-07-26 | Disposition: A | Discharge status: Home / Self Care | Payer: BC Managed Care – PPO | Source: Ambulatory Visit | Attending: Neurosurgery | Admitting: Neurosurgery

## 2020-07-26 ENCOUNTER — Other Ambulatory Visit: Payer: Self-pay

## 2020-07-26 DIAGNOSIS — D497 Neoplasm of unspecified behavior of endocrine glands and other parts of nervous system: Secondary | ICD-10-CM

## 2020-07-26 MED ORDER — GADOBENATE DIMEGLUMINE 529 MG/ML IV SOLN
10.0000 mL | Freq: Once | INTRAVENOUS | Status: AC | PRN
  Administered 2020-07-26: 10 mL via INTRAVENOUS

## 2020-10-07 ENCOUNTER — Ambulatory Visit: Payer: Self-pay

## 2021-03-20 ENCOUNTER — Other Ambulatory Visit: Payer: Self-pay | Admitting: Neurosurgery

## 2021-03-20 DIAGNOSIS — D497 Neoplasm of unspecified behavior of endocrine glands and other parts of nervous system: Secondary | ICD-10-CM

## 2021-04-06 ENCOUNTER — Ambulatory Visit
Admission: RE | Admit: 2021-04-06 | Discharge: 2021-04-06 | Disposition: A | Discharge status: Home / Self Care | Payer: BC Managed Care – PPO | Source: Ambulatory Visit | Attending: Neurosurgery | Admitting: Neurosurgery

## 2021-04-06 ENCOUNTER — Other Ambulatory Visit: Payer: Self-pay

## 2021-04-06 DIAGNOSIS — D497 Neoplasm of unspecified behavior of endocrine glands and other parts of nervous system: Secondary | ICD-10-CM

## 2021-04-06 MED ORDER — GADOBENATE DIMEGLUMINE 529 MG/ML IV SOLN
10.0000 mL | Freq: Once | INTRAVENOUS | Status: AC | PRN
  Administered 2021-04-06: 10 mL via INTRAVENOUS

## 2021-09-15 ENCOUNTER — Other Ambulatory Visit: Payer: Self-pay | Admitting: Neurosurgery

## 2021-09-15 DIAGNOSIS — D497 Neoplasm of unspecified behavior of endocrine glands and other parts of nervous system: Secondary | ICD-10-CM

## 2021-10-08 ENCOUNTER — Other Ambulatory Visit: Payer: Self-pay

## 2021-10-08 ENCOUNTER — Ambulatory Visit
Admission: RE | Admit: 2021-10-08 | Discharge: 2021-10-08 | Disposition: A | Discharge status: Home / Self Care | Payer: BC Managed Care – PPO | Source: Ambulatory Visit | Attending: Neurosurgery | Admitting: Neurosurgery

## 2021-10-08 DIAGNOSIS — D497 Neoplasm of unspecified behavior of endocrine glands and other parts of nervous system: Secondary | ICD-10-CM

## 2021-10-08 MED ORDER — GADOBENATE DIMEGLUMINE 529 MG/ML IV SOLN
10.0000 mL | Freq: Once | INTRAVENOUS | Status: AC | PRN
  Administered 2021-10-08: 10 mL via INTRAVENOUS

## 2021-11-17 ENCOUNTER — Other Ambulatory Visit: Payer: Self-pay | Admitting: Neurosurgery

## 2021-11-17 DIAGNOSIS — D497 Neoplasm of unspecified behavior of endocrine glands and other parts of nervous system: Secondary | ICD-10-CM

## 2021-11-29 ENCOUNTER — Ambulatory Visit
Admission: RE | Admit: 2021-11-29 | Discharge: 2021-11-29 | Disposition: A | Discharge status: Home / Self Care | Payer: BC Managed Care – PPO | Source: Ambulatory Visit | Attending: Neurosurgery | Admitting: Neurosurgery

## 2021-11-29 DIAGNOSIS — D497 Neoplasm of unspecified behavior of endocrine glands and other parts of nervous system: Secondary | ICD-10-CM

## 2021-11-29 MED ORDER — GADOBENATE DIMEGLUMINE 529 MG/ML IV SOLN
10.0000 mL | Freq: Once | INTRAVENOUS | Status: AC | PRN
  Administered 2021-11-29: 10 mL via INTRAVENOUS

## 2022-01-11 ENCOUNTER — Encounter: Payer: Self-pay | Admitting: Urology

## 2022-01-11 ENCOUNTER — Emergency Department (HOSPITAL_COMMUNITY)
Admission: EM | Admit: 2022-01-11 | Discharge: 2022-01-11 | Disposition: A | Discharge status: Home / Self Care | Payer: BC Managed Care – PPO | Attending: Emergency Medicine | Admitting: Emergency Medicine

## 2022-01-11 ENCOUNTER — Emergency Department (HOSPITAL_COMMUNITY): Payer: BC Managed Care – PPO

## 2022-01-11 ENCOUNTER — Ambulatory Visit: Payer: BC Managed Care – PPO | Admitting: Urology

## 2022-01-11 ENCOUNTER — Encounter (HOSPITAL_COMMUNITY): Payer: Self-pay | Admitting: Emergency Medicine

## 2022-01-11 VITALS — BP 166/96 | HR 60 | Ht 68.0 in | Wt 205.0 lb

## 2022-01-11 DIAGNOSIS — R109 Unspecified abdominal pain: Secondary | ICD-10-CM | POA: Diagnosis not present

## 2022-01-11 DIAGNOSIS — X58XXXA Exposure to other specified factors, initial encounter: Secondary | ICD-10-CM | POA: Diagnosis not present

## 2022-01-11 DIAGNOSIS — S37011A Minor contusion of right kidney, initial encounter: Secondary | ICD-10-CM | POA: Insufficient documentation

## 2022-01-11 DIAGNOSIS — S37019A Minor contusion of unspecified kidney, initial encounter: Secondary | ICD-10-CM

## 2022-01-11 DIAGNOSIS — S37001A Unspecified injury of right kidney, initial encounter: Secondary | ICD-10-CM | POA: Diagnosis present

## 2022-01-11 LAB — CBC WITH DIFFERENTIAL/PLATELET
Abs Immature Granulocytes: 0.07 10*3/uL (ref 0.00–0.07)
Basophils Absolute: 0.1 10*3/uL (ref 0.0–0.1)
Basophils Relative: 0 %
Eosinophils Absolute: 0.2 10*3/uL (ref 0.0–0.5)
Eosinophils Relative: 2 %
HCT: 43.1 % (ref 39.0–52.0)
Hemoglobin: 14.7 g/dL (ref 13.0–17.0)
Immature Granulocytes: 1 %
Lymphocytes Relative: 20 %
Lymphs Abs: 2.3 10*3/uL (ref 0.7–4.0)
MCH: 29.6 pg (ref 26.0–34.0)
MCHC: 34.1 g/dL (ref 30.0–36.0)
MCV: 86.9 fL (ref 80.0–100.0)
Monocytes Absolute: 1.1 10*3/uL — ABNORMAL HIGH (ref 0.1–1.0)
Monocytes Relative: 10 %
Neutro Abs: 7.5 10*3/uL (ref 1.7–7.7)
Neutrophils Relative %: 67 %
Platelets: 216 10*3/uL (ref 150–400)
RBC: 4.96 MIL/uL (ref 4.22–5.81)
RDW: 13 % (ref 11.5–15.5)
WBC: 11.1 10*3/uL — ABNORMAL HIGH (ref 4.0–10.5)
nRBC: 0 % (ref 0.0–0.2)

## 2022-01-11 LAB — URINALYSIS, ROUTINE W REFLEX MICROSCOPIC
Bilirubin Urine: NEGATIVE
Bilirubin, UA: NEGATIVE
Glucose, UA: >=500 mg/dL — AB
Ketones, ur: NEGATIVE mg/dL
Leukocytes,UA: NEGATIVE
Leukocytes,Ua: NEGATIVE
Nitrite, UA: NEGATIVE
Nitrite: NEGATIVE
Protein, ur: 30 mg/dL — AB
Specific Gravity, UA: 1.025 (ref 1.005–1.030)
Specific Gravity, Urine: 1.02 (ref 1.005–1.030)
Urobilinogen, Ur: 0.2 mg/dL (ref 0.2–1.0)
pH, UA: 5.5 (ref 5.0–7.5)
pH: 6 (ref 5.0–8.0)

## 2022-01-11 LAB — MICROSCOPIC EXAMINATION
Epithelial Cells (non renal): NONE SEEN /hpf (ref 0–10)
RBC, Urine: >30 /hpf — AB (ref 0–2)
Renal Epithel, UA: NONE SEEN /hpf
WBC, UA: NONE SEEN /hpf (ref 0–5)

## 2022-01-11 LAB — URINALYSIS, MICROSCOPIC (REFLEX)
Bacteria, UA: NONE SEEN
RBC / HPF: >50 RBC/hpf (ref 0–5)
Squamous Epithelial / HPF: NONE SEEN (ref 0–5)

## 2022-01-11 LAB — BASIC METABOLIC PANEL
Anion gap: 10 (ref 5–15)
BUN: 13 mg/dL (ref 6–20)
CO2: 22 mmol/L (ref 22–32)
Calcium: 9.5 mg/dL (ref 8.9–10.3)
Chloride: 105 mmol/L (ref 98–111)
Creatinine, Ser: 1.21 mg/dL (ref 0.61–1.24)
GFR, Estimated: >60 mL/min (ref >60)
Glucose, Bld: 292 mg/dL — ABNORMAL HIGH (ref 70–99)
Potassium: 3.6 mmol/L (ref 3.5–5.1)
Sodium: 137 mmol/L (ref 135–145)

## 2022-01-11 MED ORDER — OXYCODONE-ACETAMINOPHEN 5-325 MG PO TABS: 1.0000 | ORAL_TABLET | ORAL | 0 refills | Status: DC | PRN

## 2022-01-11 MED ORDER — KETOROLAC TROMETHAMINE 30 MG/ML IJ SOLN
15.0000 mg | Freq: Once | INTRAMUSCULAR | Status: AC
  Administered 2022-01-11: 15 mg via INTRAVENOUS
  Filled 2022-01-11: qty 1

## 2022-01-11 MED ORDER — METOCLOPRAMIDE HCL 5 MG/ML IJ SOLN
10.0000 mg | Freq: Once | INTRAMUSCULAR | Status: AC
Start: 2022-01-11 — End: 2022-01-11
  Administered 2022-01-11: 10 mg via INTRAVENOUS
  Filled 2022-01-11: qty 2

## 2022-01-11 MED ORDER — OXYCODONE-ACETAMINOPHEN 5-325 MG PO TABS: 1.0000 | ORAL_TABLET | Freq: Four times a day (QID) | ORAL | 0 refills | Status: DC | PRN

## 2022-01-11 MED ORDER — ONDANSETRON HCL 4 MG/2ML IJ SOLN
4.0000 mg | Freq: Once | INTRAMUSCULAR | Status: AC
  Administered 2022-01-11: 4 mg via INTRAVENOUS
  Filled 2022-01-11: qty 2

## 2022-01-11 MED ORDER — HYDROMORPHONE HCL 1 MG/ML IJ SOLN
1.0000 mg | Freq: Once | INTRAMUSCULAR | Status: AC
  Administered 2022-01-11: 1 mg via INTRAVENOUS
  Filled 2022-01-11: qty 1

## 2022-01-11 MED ORDER — ONDANSETRON 8 MG PO TBDP: 8.0000 mg | ORAL_TABLET | Freq: Three times a day (TID) | ORAL | 1 refills | Status: DC | PRN

## 2022-01-11 NOTE — Progress Notes (Signed)
? ?Assessment: ?1. Perinephric hematoma   ? ? ?Plan: ?I reviewed the patient's ER records including patient notes and lab results. ?I personally reviewed the CT study from 01/11/2022 with results as noted below. ?I discussed these results with the patient and his wife in detail today.  We discussed possible causes of a spontaneous subcapsular perinephric hematoma.  He is currently hemodynamically stable and his pain is fairly well controlled with oral pain medication. ?Repeat H&H, BMP today. ?Further evaluation with CT abdomen with and without contrast (renal protocol) tomorrow. ?Continue pain medications and anti-emetics ?Return to office in 4-5 days ?I discussed the potential need for possible embolization if there is evidence of ongoing bleeding. ?I also advised him to return to the emergency room if he has uncontrolled pain, uncontrolled nausea and vomiting, fever, chills, or dizziness/syncope. ? ?Chief Complaint:  ?Chief Complaint  ?Patient presents with  ? renal hematoma  ? ? ?History of Present Illness: ? ?Jaime Thompson is a 56 y.o. year old male who is seen in consultation from [facility], Jenetta Downer, MD for evaluation of right perinephric hematoma.  He presented to the emergency room early this morning with acute onset of right-sided flank pain.  The flank pain began several hours prior to presentation.  He reported as sharp and stabbing and severe in intensity.  He had associated nausea and vomiting.  Evaluation with CT renal stone study showed no renal or ureteral calculi and no evidence of obstruction, a 3.3 cm partially calcified cyst upper left kidney and a 6.7 x 1.4 x 7.9 cm ill-defined crescentic shaped area of heterogeneous mildly increased attenuation within the subcapsular region of the right kidney consistent with a subcapsular hematoma. ?He was hemodynamically stable. ?Labs: ?H&H 14.7/43.1 ?Creatinine 1.21 ?U/A >50 RBCs ? ?No history of trauma or anticoagulation. ?His pain is currently well  controlled with oral pain medication.  He reports his pain is a 3-4/10.  He continues to have some nausea but no vomiting.  He has tolerated liquids.  He is voiding without difficulty.  No dysuria or gross hematuria.  No fevers or chills. ? ?He has a prior history of kidney stones.  He has not required any intervention for stones. ? ?Past Medical History:  ?Past Medical History:  ?Diagnosis Date  ? Cataract   ? no surgery as yet/pre diagnosed  ? Diabetes mellitus without complication ([place])   ? Hypertension   ? Kidney stone   ? Pituitary adenoma ([place])   ? Sleep apnea   ? Stroke [facility])   ? Thyroid nodule   ? ? ?Past Surgical History:  ?Past Surgical History:  ?Procedure Laterality Date  ? bilateral inguinal hernia repair    ? HERNIA REPAIR    ? ? ?Allergies:  ?No Known Allergies ? ?Family History:  ?Family History  ?Problem Relation Age of Onset  ? Colon cancer Neg Hx   ? Colon polyps Neg Hx   ? Esophageal cancer Neg Hx   ? Rectal cancer Neg Hx   ? Stomach cancer Neg Hx   ? ? ?Social History:  ?Social History  ? ?Tobacco Use  ? Smoking status: Never  ? Smokeless tobacco: Never  ?Vaping Use  ? Vaping Use: Never used  ?Substance Use Topics  ? Alcohol use: No  ? Drug use: No  ? ? ?Review of symptoms:  ?Constitutional:  Negative for unexplained weight loss, night sweats, fever, chills ?ENT:  Negative for nose bleeds, sinus pain, painful swallowing ?CV:  Negative for chest pain,  shortness of breath, exercise intolerance, palpitations, loss of consciousness ?Resp:  Negative for cough, wheezing, shortness of breath ?GI:  Negative for diarrhea, bloody stools ?GU:  Positives noted in HPI; otherwise negative for gross hematuria, dysuria, urinary incontinence ?Neuro:  Negative for seizures, poor balance, limb weakness, slurred speech ?Psych:  Negative for lack of energy, depression, anxiety ?Endocrine:  Negative for polydipsia, polyuria, symptoms of hypoglycemia (dizziness, hunger, sweating) ?Hematologic:  Negative for anemia,  purpura, petechia, prolonged or excessive bleeding, use of anticoagulants  ?Allergic:  Negative for difficulty breathing or choking as a result of exposure to anything; no shellfish allergy; no allergic response (rash/itch) to materials, foods ? ?Physical exam: ?BP (!) 166/96   Pulse 60   Ht '5\' 8"'$  (1.727 m)   Wt 205 lb (93 kg)   BMI 31.17 kg/m?  ?GENERAL APPEARANCE:  Well appearing, well developed, well nourished, NAD ?HEENT: Atraumatic, Normocephalic, oropharynx clear. ?NECK: Supple without lymphadenopathy or thyromegaly. ?LUNGS: Clear to auscultation bilaterally. ?HEART: Regular Rate and Rhythm without murmurs, gallops, or rubs. ?ABDOMEN: Soft, non-tender, No Masses. ?EXTREMITIES: Moves all extremities well.  Without clubbing, cyanosis, or edema. ?NEUROLOGIC:  Alert and oriented x 3, normal gait, CN II-XII grossly intact.  ?MENTAL STATUS:  Appropriate. ?BACK:  Non-tender to palpation.  No CVAT ?SKIN:  Warm, dry and intact.   ? ?Results: ?U/A:  >30 RBC ? ?Results for orders placed or performed during the hospital encounter of 01/11/22 (from the past 24 hour(s))  ?CBC with Differential/Platelet  ? Collection Time: 01/11/22 12:35 AM  ?Result Value Ref Range  ? WBC 11.1 (H) 4.0 - 10.5 K/uL  ? RBC 4.96 4.22 - 5.81 MIL/uL  ? Hemoglobin 14.7 13.0 - 17.0 g/dL  ? HCT 43.1 39.0 - 52.0 %  ? MCV 86.9 80.0 - 100.0 fL  ? MCH 29.6 26.0 - 34.0 pg  ? MCHC 34.1 30.0 - 36.0 g/dL  ? RDW 13.0 11.5 - 15.5 %  ? Platelets 216 150 - 400 K/uL  ? nRBC 0.0 0.0 - 0.2 %  ? Neutrophils Relative % 67 %  ? Neutro Abs 7.5 1.7 - 7.7 K/uL  ? Lymphocytes Relative 20 %  ? Lymphs Abs 2.3 0.7 - 4.0 K/uL  ? Monocytes Relative 10 %  ? Monocytes Absolute 1.1 (H) 0.1 - 1.0 K/uL  ? Eosinophils Relative 2 %  ? Eosinophils Absolute 0.2 0.0 - 0.5 K/uL  ? Basophils Relative 0 %  ? Basophils Absolute 0.1 0.0 - 0.1 K/uL  ? Immature Granulocytes 1 %  ? Abs Immature Granulocytes 0.07 0.00 - 0.07 K/uL  ?Basic metabolic panel  ? Collection Time: 01/11/22 12:35 AM   ?Result Value Ref Range  ? Sodium 137 135 - 145 mmol/L  ? Potassium 3.6 3.5 - 5.1 mmol/L  ? Chloride 105 98 - 111 mmol/L  ? CO2 22 22 - 32 mmol/L  ? Glucose, Bld 292 (H) 70 - 99 mg/dL  ? BUN 13 6 - 20 mg/dL  ? Creatinine, Ser 1.21 0.61 - 1.24 mg/dL  ? Calcium 9.5 8.9 - 10.3 mg/dL  ? GFR, Estimated >60 >60 mL/min  ? Anion gap 10 5 - 15  ?Urinalysis, Routine w reflex microscopic Urine, Clean Catch  ? Collection Time: 01/11/22 12:58 AM  ?Result Value Ref Range  ? Color, Urine YELLOW YELLOW  ? APPearance CLEAR CLEAR  ? Specific Gravity, Urine 1.020 1.005 - 1.030  ? pH 6.0 5.0 - 8.0  ? Glucose, UA >=500 (A) NEGATIVE mg/dL  ? Hgb  urine dipstick LARGE (A) NEGATIVE  ? Bilirubin Urine NEGATIVE NEGATIVE  ? Ketones, ur NEGATIVE NEGATIVE mg/dL  ? Protein, ur 30 (A) NEGATIVE mg/dL  ? Nitrite NEGATIVE NEGATIVE  ? Leukocytes,Ua NEGATIVE NEGATIVE  ?Urinalysis, Microscopic (reflex)  ? Collection Time: 01/11/22 12:58 AM  ?Result Value Ref Range  ? RBC / HPF >50 0 - 5 RBC/hpf  ? WBC, UA 0-5 0 - 5 WBC/hpf  ? Bacteria, UA NONE SEEN NONE SEEN  ? Squamous Epithelial / LPF NONE SEEN 0 - 5  ? ? ?

## 2022-01-11 NOTE — ED Triage Notes (Signed)
Pt c/o sudden right flank pain that started tonight at about 10pm. Hx of kidney stones.  ?

## 2022-01-11 NOTE — Discharge Instructions (Signed)
The cause of your pain was identified as bleeding in your right kidney.  Source of the bleeding is unclear.  Avoid NSAIDs.  Take pain medication as needed.  Will need to follow-up with urology for further evaluation of the source.  If you develop uncontrolled pain, fever or signs of blood loss such as racing heartbeat, shortness of breath, dizziness or passing out, return to the ER immediately. ?

## 2022-01-11 NOTE — ED Provider Notes (Incomplete)
?[name] ?Provider Note ? ? ?CSN: [id number] ?Arrival date & time: 01/11/22  0021 ? ?  ? ?History ?{Add pertinent medical, surgical, social history, OB history to HPI:1} ?Chief Complaint  ?Patient presents with  ?? Flank Pain  ? ? ?Jaime Thompson is a 56 y.o. male. ? ?Patient presents to the emergency department for evaluation of right flank pain.  Patient reports that the pain began fairly suddenly at 10:30 PM.  Pain is sharp, stabbing and severe.  Pain is constant, feels similar to when he had a kidney stone 3 or 4 years ago.  He has had nausea and vomiting. ? ? ?  ? ?Home Medications ?Prior to Admission medications   ?Medication Sig Start Date End Date Taking? Authorizing Provider  ?acetaminophen (TYLENOL) 500 MG tablet Take 1,000 mg by mouth every 6 (six) hours as needed for headache (pain).    [provider]  ?artificial tears (LACRILUBE) OINT ophthalmic ointment Place into the right eye at bedtime. 02/08/20   Donzetta Starch, NP  ?atenolol (TENORMIN) 25 MG tablet Take 75 mg by mouth daily.  12/25/12   [provider]  ?atorvastatin (LIPITOR) 20 MG tablet Take 20 mg by mouth daily. 01/02/20   [provider]  ?diltiazem (CARTIA XT) 120 MG 24 hr capsule Take 120 mg by mouth daily.    [provider]  ?escitalopram (LEXAPRO) 10 MG tablet Take 10 mg by mouth daily. 01/02/20   [provider]  ?glipiZIDE (GLUCOTROL XL) 5 MG 24 hr tablet Take 5 mg by mouth daily. 12/05/19   [provider]  ?metFORMIN (GLUCOPHAGE) 1000 MG tablet Take 1 tablet (1,000 mg total) by mouth 2 (two) times daily. 02/10/20   Donzetta Starch, NP  ?montelukast (SINGULAIR) 10 MG tablet Take 10 mg by mouth daily as needed (seasonal allergies).  11/10/19   [provider]  ?ondansetron (ZOFRAN-ODT) 8 MG disintegrating tablet Take 8 mg by mouth every 8 (eight) hours as needed for nausea or vomiting.  01/19/20   [provider]  ?polyvinyl alcohol (LIQUIFILM TEARS) 1.4 %  ophthalmic solution Place 1 drop into the right eye as needed for dry eyes. 02/08/20   Donzetta Starch, NP  ?sildenafil (REVATIO) 20 MG tablet Take 40 mg by mouth daily as needed (erectile dysfunction).  09/11/19   [provider]  ?telmisartan (MICARDIS) 40 MG tablet Take 40 mg by mouth daily.  11/10/10   [provider]  ?Testosterone 20.25 MG/ACT (1.62%) GEL Apply 20.25 mg topically See admin instructions. Apply 1 pump (20.25 mg) topically to each shoulder once daily 09/09/10   [provider]  ?   ? ?Allergies    ?Patient has no known allergies.   ? ?Review of Systems   ?Review of Systems ? ?Physical Exam ?Updated Vital Signs ?Ht '5\' 8"'$  (1.727 m)   Wt 93 kg   BMI 31.17 kg/m?  ?Physical Exam ?Vitals and nursing note reviewed.  ?Constitutional:   ?   General: He is not in acute distress. ?   Appearance: He is well-developed.  ?HENT:  ?   Head: Normocephalic and atraumatic.  ?   Mouth/Throat:  ?   Mouth: Mucous membranes are moist.  ?Eyes:  ?   General: Vision grossly intact. Gaze aligned appropriately.  ?   Extraocular Movements: Extraocular movements intact.  ?   Conjunctiva/sclera: Conjunctivae normal.  ?Cardiovascular:  ?   Rate and Rhythm: Normal rate and regular rhythm.  ?   Pulses:  Normal pulses.  ?   Heart sounds: Normal heart sounds, S1 normal and S2 normal. No murmur heard. ?  No friction rub. No gallop.  ?Pulmonary:  ?   Effort: Pulmonary effort is normal. No respiratory distress.  ?   Breath sounds: Normal breath sounds.  ?Abdominal:  ?   Palpations: Abdomen is soft.  ?   Tenderness: There is abdominal tenderness in the right lower quadrant. There is right CVA tenderness. There is no guarding or rebound.  ?   Hernia: No hernia is present.  ?Musculoskeletal:     ?   General: No swelling.  ?   Cervical back: Full passive range of motion without pain, normal range of motion and neck supple. No pain with movement, spinous process tenderness or muscular tenderness. Normal range of motion.   ?   Right lower leg: No edema.  ?   Left lower leg: No edema.  ?Skin: ?   General: Skin is warm and dry.  ?   Capillary Refill: Capillary refill takes less than 2 seconds.  ?   Findings: No ecchymosis, erythema, lesion or wound.  ?Neurological:  ?   Mental Status: He is alert and oriented to person, place, and time.  ?   GCS: GCS eye subscore is 4. GCS verbal subscore is 5. GCS motor subscore is 6.  ?   Cranial Nerves: Cranial nerves 2-12 are intact.  ?   Sensory: Sensation is intact.  ?   Motor: Motor function is intact. No weakness or abnormal muscle tone.  ?   Coordination: Coordination is intact.  ?Psychiatric:     ?   Mood and Affect: Mood normal.     ?   Speech: Speech normal.     ?   Behavior: Behavior normal.  ? ? ?ED Results / Procedures / Treatments   ?Labs ?(all labs ordered are listed, but only abnormal results are displayed) ?Labs Reviewed  ?CBC WITH DIFFERENTIAL/PLATELET  ?BASIC METABOLIC PANEL  ?URINALYSIS, ROUTINE W REFLEX MICROSCOPIC  ? ? ?EKG ?None ? ?Radiology ?No results found. ? ?Procedures ?Procedures  ?{Document cardiac monitor, telemetry assessment procedure when appropriate:1} ? ?Medications Ordered in ED ?Medications  ?ondansetron (ZOFRAN) injection 4 mg (has no administration in time range)  ?HYDROmorphone (DILAUDID) injection 1 mg (has no administration in time range)  ?ketorolac (TORADOL) 30 MG/ML injection 15 mg (has no administration in time range)  ? ? ?ED Course/ Medical Decision Making/ A&P ?  ?                        ?Medical Decision Making ?Amount and/or Complexity of Data Reviewed ?Labs: ordered. ?Radiology: ordered. ? ?Risk ?Prescription drug management. ? ? ?Patient with sudden onset right flank pain.  Differential diagnosis includes renal colic, appendicitis, colitis. ? ?{Document critical care time when appropriate:1} ?{Document review of labs and clinical decision tools ie heart score, Chads2Vasc2 etc:1}  ?{Document your independent review of radiology images, and any  outside records:1} ?{Document your discussion with family members, caretakers, and with consultants:1} ?{Document social determinants of health affecting pt's care:1} ?{Document your decision making why or why not admission, treatments were needed:1} ?Final Clinical Impression(s) / ED Diagnoses ?Final diagnoses:  ?None  ? ? ?Rx / DC Orders ?ED Discharge Orders   ? ? None  ? ?  ? ? ?

## 2022-01-11 NOTE — ED Provider Notes (Signed)
?[facility] ?Provider Note ? ? ?CSN: [id number] ?Arrival date & time: 01/11/22  0021 ? ?  ? ?History ? ?Chief Complaint  ?Patient presents with  ? Flank Pain  ? ? ?Jaime Thompson is a 56 y.o. male. ? ?Patient presents to the emergency department for evaluation of right flank pain.  Patient reports that the pain began fairly suddenly at 10:30 PM.  Pain is sharp, stabbing and severe.  Pain is constant, feels similar to when he had a kidney stone 3 or 4 years ago.  He has had nausea and vomiting. ? ? ?  ? ?Home Medications ?Prior to Admission medications   ?Medication Sig Start Date End Date Taking? Authorizing Provider  ?oxyCODONE-acetaminophen (PERCOCET) 5-325 MG tablet Take 1-2 tablets by mouth every 4 (four) hours as needed. 01/11/22  Yes Kalayla Shadden, Gwenyth Allegra, MD  ?oxyCODONE-acetaminophen (PERCOCET/ROXICET) 5-325 MG tablet Take 1 tablet by mouth every 6 (six) hours as needed for severe pain. 01/11/22  Yes Michaiah Maiden, Gwenyth Allegra, MD  ?acetaminophen (TYLENOL) 500 MG tablet Take 1,000 mg by mouth every 6 (six) hours as needed for headache (pain).    [provider]  ?artificial tears (LACRILUBE) OINT ophthalmic ointment Place into the right eye at bedtime. 02/08/20   Donzetta Starch, NP  ?atenolol (TENORMIN) 25 MG tablet Take 75 mg by mouth daily.  12/25/12   [provider]  ?atorvastatin (LIPITOR) 20 MG tablet Take 20 mg by mouth daily. 01/02/20   [provider]  ?diltiazem (CARTIA XT) 120 MG 24 hr capsule Take 120 mg by mouth daily.    [provider]  ?escitalopram (LEXAPRO) 10 MG tablet Take 10 mg by mouth daily. 01/02/20   [provider]  ?glipiZIDE (GLUCOTROL XL) 5 MG 24 hr tablet Take 5 mg by mouth daily. 12/05/19   [provider]  ?metFORMIN (GLUCOPHAGE) 1000 MG tablet Take 1 tablet (1,000 mg total) by mouth 2 (two) times daily. 02/10/20   Donzetta Starch, NP  ?montelukast (SINGULAIR) 10 MG tablet Take 10 mg by mouth daily as needed (seasonal  allergies).  11/10/19   [provider]  ?ondansetron (ZOFRAN-ODT) 8 MG disintegrating tablet Take 8 mg by mouth every 8 (eight) hours as needed for nausea or vomiting.  01/19/20   [provider]  ?polyvinyl alcohol (LIQUIFILM TEARS) 1.4 % ophthalmic solution Place 1 drop into the right eye as needed for dry eyes. 02/08/20   Donzetta Starch, NP  ?sildenafil (REVATIO) 20 MG tablet Take 40 mg by mouth daily as needed (erectile dysfunction).  09/11/19   [provider]  ?telmisartan (MICARDIS) 40 MG tablet Take 40 mg by mouth daily.  11/10/10   [provider]  ?Testosterone 20.25 MG/ACT (1.62%) GEL Apply 20.25 mg topically See admin instructions. Apply 1 pump (20.25 mg) topically to each shoulder once daily 09/09/10   [provider]  ?   ? ?Allergies    ?Patient has no known allergies.   ? ?Review of Systems   ?Review of Systems ? ?Physical Exam ?Updated Vital Signs ?BP 140/76   Pulse 63   Temp 98.2 ?F (36.8 ?C) (Oral)   Resp 18   Ht '5\' 8"'$  (1.727 m)   Wt 93 kg   SpO2 98%   BMI 31.17 kg/m?  ?Physical Exam ?Vitals and nursing note reviewed.  ?Constitutional:   ?   General: He is in acute distress.  ?   Appearance: He is well-developed.  ?HENT:  ?  Head: Normocephalic and atraumatic.  ?   Mouth/Throat:  ?   Mouth: Mucous membranes are moist.  ?Eyes:  ?   General: Vision grossly intact. Gaze aligned appropriately.  ?   Extraocular Movements: Extraocular movements intact.  ?   Conjunctiva/sclera: Conjunctivae normal.  ?Cardiovascular:  ?   Rate and Rhythm: Normal rate and regular rhythm.  ?   Pulses: Normal pulses.  ?   Heart sounds: Normal heart sounds, S1 normal and S2 normal. No murmur heard. ?  No friction rub. No gallop.  ?Pulmonary:  ?   Effort: Pulmonary effort is normal. No respiratory distress.  ?   Breath sounds: Normal breath sounds.  ?Abdominal:  ?   Palpations: Abdomen is soft.  ?   Tenderness: There is abdominal tenderness in the right lower quadrant. There is right  CVA tenderness. There is no guarding or rebound.  ?   Hernia: No hernia is present.  ?Musculoskeletal:     ?   General: No swelling.  ?   Cervical back: Full passive range of motion without pain, normal range of motion and neck supple. No pain with movement, spinous process tenderness or muscular tenderness. Normal range of motion.  ?   Right lower leg: No edema.  ?   Left lower leg: No edema.  ?Skin: ?   General: Skin is warm and dry.  ?   Capillary Refill: Capillary refill takes less than 2 seconds.  ?   Findings: No ecchymosis, erythema, lesion or wound.  ?Neurological:  ?   Mental Status: He is alert and oriented to person, place, and time.  ?   GCS: GCS eye subscore is 4. GCS verbal subscore is 5. GCS motor subscore is 6.  ?   Cranial Nerves: Cranial nerves 2-12 are intact.  ?   Sensory: Sensation is intact.  ?   Motor: Motor function is intact. No weakness or abnormal muscle tone.  ?   Coordination: Coordination is intact.  ?Psychiatric:     ?   Mood and Affect: Mood normal.     ?   Speech: Speech normal.     ?   Behavior: Behavior normal.  ? ? ?ED Results / Procedures / Treatments   ?Labs ?(all labs ordered are listed, but only abnormal results are displayed) ?Labs Reviewed  ?CBC WITH DIFFERENTIAL/PLATELET - Abnormal; Notable for the following components:  ?    Result Value  ? WBC 11.1 (*)   ? Monocytes Absolute 1.1 (*)   ? All other components within normal limits  ?BASIC METABOLIC PANEL - Abnormal; Notable for the following components:  ? Glucose, Bld 292 (*)   ? All other components within normal limits  ?URINALYSIS, ROUTINE W REFLEX MICROSCOPIC - Abnormal; Notable for the following components:  ? Glucose, UA >=500 (*)   ? Hgb urine dipstick LARGE (*)   ? Protein, ur 30 (*)   ? All other components within normal limits  ?URINALYSIS, MICROSCOPIC (REFLEX)  ? ? ?EKG ?None ? ?Radiology ?CT RENAL STONE STUDY ? ?Result Date: 01/11/2022 ?CLINICAL DATA:  Right flank pain. EXAM: CT ABDOMEN AND PELVIS WITHOUT  CONTRAST TECHNIQUE: Multidetector CT imaging of the abdomen and pelvis was performed following the standard protocol without IV contrast. RADIATION DOSE REDUCTION: This exam was performed according to the departmental dose-optimization program which includes automated exposure control, adjustment of the mA and/or kV according to patient size and/or use of iterative reconstruction technique. COMPARISON:  None Available. FINDINGS: Lower chest: No acute  abnormality. Hepatobiliary: No focal liver abnormality is seen. No gallstones, gallbladder wall thickening, or biliary dilatation. Pancreas: Unremarkable. No pancreatic ductal dilatation or surrounding inflammatory changes. Spleen: Normal in size without focal abnormality. Adrenals/Urinary Tract: Adrenal glands are unremarkable. There is no evidence of obstructing renal calculi or hydronephrosis. A 3.3 cm diameter partially calcified cyst is seen within the anterior aspect of the mid to upper left kidney. A 6.7 cm x 1.4 cm x 7.9 cm, ill-defined, crescentic shaped area of heterogeneous, mildly increased attenuation (approximately 53.80 Hounsfield units) is seen within the subcapsular region on the right (best seen on coronal reformatted image 57, CT series 5). Subsequent peripheral encapsulation of a portion of the right kidney is noted. Mild, bilateral perinephric inflammatory fat stranding is also seen, right slightly greater than left. Bladder is unremarkable. Stomach/Bowel: Stomach is within normal limits. Appendix appears normal. No evidence of bowel wall thickening, distention, or inflammatory changes. Vascular/Lymphatic: Aortic atherosclerosis. No enlarged abdominal or pelvic lymph nodes. Reproductive: Prostate is unremarkable. Other: No abdominal wall hernia or abnormality. No abdominopelvic ascites. Musculoskeletal: No acute or significant osseous findings. IMPRESSION: 1. Findings consistent with a right-sided subcapsular perirenal hematoma. In the absence of  recent trauma, this is worrisome for the presence of a hemorrhagic renal mass. Urology consult is recommended to further evaluate for the presence of renal cell carcinoma. 2. 3.3 cm partially calcified left renal cyst. 3.

## 2022-01-12 ENCOUNTER — Ambulatory Visit: Payer: BC Managed Care – PPO | Admitting: Urology

## 2022-01-12 ENCOUNTER — Ambulatory Visit (HOSPITAL_COMMUNITY)
Admission: RE | Admit: 2022-01-12 | Discharge: 2022-01-12 | Disposition: A | Discharge status: Home / Self Care | Payer: BC Managed Care – PPO | Source: Ambulatory Visit | Attending: Urology | Admitting: Urology

## 2022-01-12 DIAGNOSIS — S37019A Minor contusion of unspecified kidney, initial encounter: Secondary | ICD-10-CM | POA: Insufficient documentation

## 2022-01-12 LAB — BASIC METABOLIC PANEL
BUN/Creatinine Ratio: 10 (ref 9–20)
BUN: 16 mg/dL (ref 6–24)
CO2: 20 mmol/L (ref 20–29)
Calcium: 9.4 mg/dL (ref 8.7–10.2)
Chloride: 100 mmol/L (ref 96–106)
Creatinine, Ser: 1.53 mg/dL — ABNORMAL HIGH (ref 0.76–1.27)
Glucose: 353 mg/dL — ABNORMAL HIGH (ref 70–99)
Potassium: 5.1 mmol/L (ref 3.5–5.2)
Sodium: 138 mmol/L (ref 134–144)
eGFR: 53 mL/min/{1.73_m2} — ABNORMAL LOW (ref >59)

## 2022-01-12 LAB — HEMOGLOBIN AND HEMATOCRIT, BLOOD
Hematocrit: 42.6 % (ref 37.5–51.0)
Hemoglobin: 13.9 g/dL (ref 13.0–17.7)

## 2022-01-12 MED ORDER — IOHEXOL 300 MG/ML  SOLN
100.0000 mL | Freq: Once | INTRAMUSCULAR | Status: AC | PRN
  Administered 2022-01-12: 100 mL via INTRAVENOUS

## 2022-01-12 MED FILL — Oxycodone w/ Acetaminophen Tab 5-325 MG: ORAL | Qty: 6 | Status: AC

## 2022-01-16 ENCOUNTER — Encounter: Payer: Self-pay | Admitting: Urology

## 2022-01-16 ENCOUNTER — Ambulatory Visit (INDEPENDENT_AMBULATORY_CARE_PROVIDER_SITE_OTHER): Payer: BC Managed Care – PPO | Admitting: Urology

## 2022-01-16 VITALS — BP 155/85 | HR 61 | Wt 200.0 lb

## 2022-01-16 DIAGNOSIS — S37019A Minor contusion of unspecified kidney, initial encounter: Secondary | ICD-10-CM | POA: Diagnosis not present

## 2022-01-16 NOTE — Progress Notes (Signed)
? ?Assessment: ?1. Perinephric hematoma; spontaneous; RIGHT   ? ? ?Plan: ?I personally reviewed the CT study from 01/12/2022 with results as noted below. ?I reviewed the labs and CT results with the patient. ?Return to office in 7-10 days ?Labs next visit ?Work excuse for approximately 3 weeks.  ? ?Chief Complaint:  ?Chief Complaint  ?Patient presents with  ? Renal hematoma  ? ? ?History of Present Illness: ? ?Jaime Thompson is a 56 y.o. year old male who is seen for further evaluation of right perinephric hematoma.  He presented to the emergency room on 01/11/22 with acute onset of right-sided flank pain.  The flank pain began several hours prior to presentation.  He reported as sharp and stabbing and severe in intensity.  He had associated nausea and vomiting.  Evaluation with CT renal stone study showed no renal or ureteral calculi and no evidence of obstruction, a 3.3 cm partially calcified cyst upper left kidney and a 6.7 x 1.4 x 7.9 cm ill-defined crescentic shaped area of heterogeneous mildly increased attenuation within the subcapsular region of the right kidney consistent with a subcapsular hematoma. ?He was hemodynamically stable. ?Labs: ?H&H 14.7/43.1 ?Creatinine 1.21 ?U/A >50 RBCs ? ?No history of trauma or anticoagulation. ?His pain was well controlled with oral pain medication.  He reported his pain was a 3-4/10.  He continued to have some nausea but no vomiting.  He was voiding without difficulty.  No dysuria or gross hematuria.  No fevers or chills. ? ?He has a prior history of kidney stones.  He has not required any intervention for stones. ? ?Labs from 01/11/2022: ?H&H 13.9/42.6 ?Creatinine 1.53 ? ?CT abdomen with and without contrast from 01/12/2022 demonstrated no substantial change in the right perinephric hematoma, mass effect on the right kidney resulting in delayed enhancement and contrast excretion, no definite mass lesion within the right kidney, tiny 9 mm hypoattenuating lesion toward the lower  pole, indeterminate, bilateral nonobstructing nephrolithiasis, stable left renal cyst. ? ?He returns today for follow-up.  His right-sided flank pain has improved.  He is no longer taking pain medication.  He is using Tylenol.  He continues to have some nausea but is tolerating p.o. liquids and solid foods well.  He is not having any dysuria or gross hematuria.  No fevers or chills. ? ?Portions of the above documentation were copied from a prior visit for review purposes only. ? ? ?Past Medical History:  ?Past Medical History:  ?Diagnosis Date  ? Cataract   ? no surgery as yet/pre diagnosed  ? Diabetes mellitus without complication ([place])   ? Hypertension   ? Kidney stone   ? Pituitary adenoma ([place])   ? Sleep apnea   ? Stroke [facility])   ? Thyroid nodule   ? ? ?Past Surgical History:  ?Past Surgical History:  ?Procedure Laterality Date  ? bilateral inguinal hernia repair    ? HERNIA REPAIR    ? ? ?Allergies:  ?No Known Allergies ? ?Family History:  ?Family History  ?Problem Relation Age of Onset  ? Colon cancer Neg Hx   ? Colon polyps Neg Hx   ? Esophageal cancer Neg Hx   ? Rectal cancer Neg Hx   ? Stomach cancer Neg Hx   ? ? ?Social History:  ?Social History  ? ?Tobacco Use  ? Smoking status: Never  ? Smokeless tobacco: Never  ?Vaping Use  ? Vaping Use: Never used  ?Substance Use Topics  ? Alcohol use: No  ? Drug use:  No  ? ? ?ROS: ?Constitutional:  Negative for fever, chills, weight loss ?CV: Negative for chest pain, previous MI, hypertension ?Respiratory:  Negative for shortness of breath, wheezing, sleep apnea, frequent cough ?GI:  Negative for nausea, vomiting, bloody stool, GERD ? ?Physical exam: ?BP (!) 155/85   Pulse 61   Wt 200 lb (90.7 kg)   BMI 30.41 kg/m?  ?GENERAL APPEARANCE:  Well appearing, well developed, well nourished, NAD ?HEENT:  Atraumatic, normocephalic, oropharynx clear ?NECK:  Supple without lymphadenopathy or thyromegaly ?ABDOMEN:  Soft, non-tender, no masses ?EXTREMITIES:  Moves all  extremities well, without clubbing, cyanosis, or edema ?NEUROLOGIC:  Alert and oriented x 3, normal gait, CN II-XII grossly intact ?MENTAL STATUS:  appropriate ?BACK:  Non-tender to palpation, No CVAT ?SKIN:  Warm, dry, and intact ? ?Results: ?U/A:  3-10 RBC, few bacteria ? ?

## 2022-01-17 LAB — URINALYSIS, ROUTINE W REFLEX MICROSCOPIC
Bilirubin, UA: NEGATIVE
Leukocytes,UA: NEGATIVE
Nitrite, UA: NEGATIVE
Specific Gravity, UA: 1.02 (ref 1.005–1.030)
Urobilinogen, Ur: 0.2 mg/dL (ref 0.2–1.0)
pH, UA: 6 (ref 5.0–7.5)

## 2022-01-17 LAB — MICROSCOPIC EXAMINATION
Epithelial Cells (non renal): NONE SEEN /hpf (ref 0–10)
Renal Epithel, UA: NONE SEEN /hpf
WBC, UA: NONE SEEN /hpf (ref 0–5)

## 2022-01-19 ENCOUNTER — Telehealth: Payer: Self-pay

## 2022-01-19 NOTE — Telephone Encounter (Signed)
Patient's wife, Manuela Schwartz is requesting an out of work note from Dr. Felipa Eth for patient. She is not sure how long it will need to be for.  Thinking from 01-16-2022 until sometime in June.  Fax note to [phone] Att: Yehuda Savannah  Thanks, Rolley Sims

## 2022-01-19 NOTE — Telephone Encounter (Signed)
Letter printed and faxed

## 2022-01-26 ENCOUNTER — Ambulatory Visit (INDEPENDENT_AMBULATORY_CARE_PROVIDER_SITE_OTHER): Payer: BC Managed Care – PPO | Admitting: Urology

## 2022-01-26 ENCOUNTER — Ambulatory Visit: Payer: BC Managed Care – PPO | Admitting: Urology

## 2022-01-26 ENCOUNTER — Encounter: Payer: Self-pay | Admitting: Urology

## 2022-01-26 VITALS — BP 154/69 | HR 66 | Ht 68.0 in | Wt 200.0 lb

## 2022-01-26 DIAGNOSIS — S37019A Minor contusion of unspecified kidney, initial encounter: Secondary | ICD-10-CM

## 2022-01-26 LAB — MICROSCOPIC EXAMINATION
Bacteria, UA: NONE SEEN
Epithelial Cells (non renal): NONE SEEN /hpf (ref 0–10)
Renal Epithel, UA: NONE SEEN /hpf
WBC, UA: NONE SEEN /hpf (ref 0–5)

## 2022-01-26 LAB — URINALYSIS, ROUTINE W REFLEX MICROSCOPIC
Bilirubin, UA: NEGATIVE
Ketones, UA: NEGATIVE
Leukocytes,UA: NEGATIVE
Nitrite, UA: NEGATIVE
Specific Gravity, UA: 1.02 (ref 1.005–1.030)
Urobilinogen, Ur: 0.2 mg/dL (ref 0.2–1.0)
pH, UA: 5.5 (ref 5.0–7.5)

## 2022-01-26 MED ORDER — ONDANSETRON 8 MG PO TBDP: 8.0000 mg | ORAL_TABLET | Freq: Three times a day (TID) | ORAL | 1 refills | Status: AC | PRN

## 2022-01-26 MED ORDER — OXYCODONE-ACETAMINOPHEN 5-325 MG PO TABS: 1.0000 | ORAL_TABLET | Freq: Four times a day (QID) | ORAL | 0 refills | Status: AC | PRN

## 2022-01-26 NOTE — Progress Notes (Signed)
Assessment: 1. Perinephric hematoma; spontaneous; RIGHT      Plan: Labs today - BMP, CBC Renal U/S at [facility] next week - will call with results Return to office in 4 weeks Will need further evaluation with MRI once perinephric hematoma resolves   Chief Complaint:  Chief Complaint  Patient presents with   renal hematoma    History of Present Illness:  Jaime Thompson is a 56 y.o. year old male who is seen for further evaluation of right perinephric hematoma.  He presented to the emergency room on 01/11/22 with acute onset of right-sided flank pain.  The flank pain began several hours prior to presentation.  He reported as sharp and stabbing and severe in intensity.  He had associated nausea and vomiting.  Evaluation with CT renal stone study showed no renal or ureteral calculi and no evidence of obstruction, a 3.3 cm partially calcified cyst upper left kidney and a 6.7 x 1.4 x 7.9 cm ill-defined crescentic shaped area of heterogeneous mildly increased attenuation within the subcapsular region of the right kidney consistent with a subcapsular hematoma. He was hemodynamically stable. Labs: H&H 14.7/43.1 Creatinine 1.21 U/A >50 RBCs  No history of trauma or anticoagulation. His pain was well controlled with oral pain medication.  He reported his pain was a 3-4/10.  He continued to have some nausea but no vomiting.  He was voiding without difficulty.  No dysuria or gross hematuria.  No fevers or chills.  He has a prior history of kidney stones.  He has not required any intervention for stones.  Labs from 01/11/2022: H&H 13.9/42.6 Creatinine 1.53  CT abdomen with and without contrast from 01/12/2022 demonstrated no substantial change in the right perinephric hematoma, mass effect on the right kidney resulting in delayed enhancement and contrast excretion, no definite mass lesion within the right kidney, tiny 9 mm hypoattenuating lesion toward the lower pole, indeterminate,  bilateral nonobstructing nephrolithiasis, stable left renal cyst.  At his visit on 01/16/22, his right-sided flank pain had improved.  He was no longer taking pain medication and was only using Tylenol.  He continued to have some nausea but is tolerating p.o. liquids and solid foods well.  No dysuria or gross hematuria.  No fevers or chills.  He returns today for follow-up.  He had some increased pain last week requiring oral pain mediation.  This has improved.  He continues with some nausea, no vomiting.  He is having regular BM's.  He feels fatigued and tired.  No dizziness.  No fever or chills.  No dysuria or gross hematuria. IPSS = 1 today.  Portions of the above documentation were copied from a prior visit for review purposes only.   Past Medical History:  Past Medical History:  Diagnosis Date   Cataract    no surgery as yet/pre diagnosed   Diabetes mellitus without complication ([place])    Hypertension    Kidney stone    Pituitary adenoma ([place])    Sleep apnea    Stroke ([place])    Thyroid nodule     Past Surgical History:  Past Surgical History:  Procedure Laterality Date   bilateral inguinal hernia repair     HERNIA REPAIR      Allergies:  No Known Allergies  Family History:  Family History  Problem Relation Age of Onset   Colon cancer Neg Hx    Colon polyps Neg Hx    Esophageal cancer Neg Hx    Rectal cancer Neg Hx  Stomach cancer Neg Hx     Social History:  Social History   Tobacco Use   Smoking status: Never   Smokeless tobacco: Never  Vaping Use   Vaping Use: Never used  Substance Use Topics   Alcohol use: No   Drug use: No    ROS: Constitutional:  Negative for fever, chills, weight loss CV: Negative for chest pain, previous MI, hypertension Respiratory:  Negative for shortness of breath, wheezing, sleep apnea, frequent cough GI:  Negative for nausea, vomiting, bloody stool, GERD  Physical exam: BP (!) 154/69   Pulse 66   Ht '5\' 8"'$  (1.727 m)    Wt 200 lb (90.7 kg)   BMI 30.41 kg/m  GENERAL APPEARANCE:  Well appearing, well developed, well nourished, NAD HEENT:  Atraumatic, normocephalic, oropharynx clear NECK:  Supple without lymphadenopathy or thyromegaly ABDOMEN:  Soft, minimal tenderness over RUQ, no masses EXTREMITIES:  Moves all extremities well, without clubbing, cyanosis, or edema NEUROLOGIC:  Alert and oriented x 3, normal gait, CN II-XII grossly intact MENTAL STATUS:  appropriate BACK:  Non-tender to palpation, No CVAT SKIN:  Warm, dry, and intact  Results: U/A: 0-2 RBCs

## 2022-01-27 LAB — BASIC METABOLIC PANEL
BUN/Creatinine Ratio: 9 (ref 9–20)
BUN: 14 mg/dL (ref 6–24)
CO2: 23 mmol/L (ref 20–29)
Calcium: 9.8 mg/dL (ref 8.7–10.2)
Chloride: 101 mmol/L (ref 96–106)
Creatinine, Ser: 1.62 mg/dL — ABNORMAL HIGH (ref 0.76–1.27)
Glucose: 183 mg/dL — ABNORMAL HIGH (ref 70–99)
Potassium: 5.6 mmol/L — ABNORMAL HIGH (ref 3.5–5.2)
Sodium: 139 mmol/L (ref 134–144)
eGFR: 50 mL/min/{1.73_m2} — ABNORMAL LOW (ref >59)

## 2022-01-27 LAB — CBC
Hematocrit: 42.1 % (ref 37.5–51.0)
Hemoglobin: 13.7 g/dL (ref 13.0–17.7)
MCH: 28.9 pg (ref 26.6–33.0)
MCHC: 32.5 g/dL (ref 31.5–35.7)
MCV: 89 fL (ref 79–97)
Platelets: 525 10*3/uL — ABNORMAL HIGH (ref 150–450)
RBC: 4.74 x10E6/uL (ref 4.14–5.80)
RDW: 12.6 % (ref 11.6–15.4)
WBC: 12.4 10*3/uL — ABNORMAL HIGH (ref 3.4–10.8)

## 2022-01-28 ENCOUNTER — Encounter: Payer: Self-pay | Admitting: Urology

## 2022-02-06 ENCOUNTER — Ambulatory Visit (HOSPITAL_COMMUNITY)
Admission: RE | Admit: 2022-02-06 | Discharge: 2022-02-06 | Disposition: A | Discharge status: Home / Self Care | Payer: BC Managed Care – PPO | Source: Ambulatory Visit | Attending: Urology | Admitting: Urology

## 2022-02-06 DIAGNOSIS — S37019A Minor contusion of unspecified kidney, initial encounter: Secondary | ICD-10-CM | POA: Insufficient documentation

## 2022-02-07 ENCOUNTER — Telehealth: Payer: Self-pay

## 2022-02-07 NOTE — Telephone Encounter (Signed)
Patient called advising he felt he needed another week before returning to work. He advised the previous note had him returning 6/12 and he wanted to know if he could return 6/19. I wanted to check with you regarding same before faxing a note to patients work.    Thank you!   Fax [phone]

## 2022-02-07 NOTE — Telephone Encounter (Signed)
Attaching you to this as well patient advised Dr. Diona Fanti wrote the initial letter but I think he mean you DR. [place]

## 2022-02-08 ENCOUNTER — Encounter: Payer: Self-pay | Admitting: Urology

## 2022-02-14 ENCOUNTER — Other Ambulatory Visit: Payer: Self-pay | Admitting: Neurosurgery

## 2022-02-14 DIAGNOSIS — D497 Neoplasm of unspecified behavior of endocrine glands and other parts of nervous system: Secondary | ICD-10-CM

## 2022-02-22 ENCOUNTER — Encounter: Payer: Self-pay | Admitting: Urology

## 2022-02-22 ENCOUNTER — Ambulatory Visit: Payer: BC Managed Care – PPO | Admitting: Urology

## 2022-02-22 VITALS — BP 165/82 | HR 67

## 2022-02-22 DIAGNOSIS — S37019A Minor contusion of unspecified kidney, initial encounter: Secondary | ICD-10-CM | POA: Diagnosis not present

## 2022-02-22 LAB — URINALYSIS, ROUTINE W REFLEX MICROSCOPIC
Bilirubin, UA: NEGATIVE
Ketones, UA: NEGATIVE
Leukocytes,UA: NEGATIVE
Nitrite, UA: NEGATIVE
Protein,UA: NEGATIVE
RBC, UA: NEGATIVE
Specific Gravity, UA: 1.01 (ref 1.005–1.030)
Urobilinogen, Ur: 0.2 mg/dL (ref 0.2–1.0)
pH, UA: 5.5 (ref 5.0–7.5)

## 2022-02-22 NOTE — Progress Notes (Signed)
Assessment: 1. Perinephric hematoma; spontaneous; RIGHT     Plan: Return to office in 6 weeks with previsit renal ultrasound. Will need further evaluation with MRI once perinephric hematoma resolves   Chief Complaint:  Chief Complaint  Patient presents with   renal hematoma   History of Present Illness:  Jaime Thompson is a 56 y.o. year old male who is seen for further evaluation of right perinephric hematoma.  He presented to the emergency room on 01/11/22 with acute onset of right-sided flank pain.  The flank pain began several hours prior to presentation.  He reported as sharp and stabbing and severe in intensity.  He had associated nausea and vomiting.  Evaluation with CT renal stone study showed no renal or ureteral calculi and no evidence of obstruction, a 3.3 cm partially calcified cyst upper left kidney and a 6.7 x 1.4 x 7.9 cm ill-defined crescentic shaped area of heterogeneous mildly increased attenuation within the subcapsular region of the right kidney consistent with a subcapsular hematoma. He was hemodynamically stable. Labs: H&H 14.7/43.1 Creatinine 1.21 U/A >50 RBCs  No history of trauma or anticoagulation. His pain was well controlled with oral pain medication.  He reported his pain was a 3-4/10.  He continued to have some nausea but no vomiting.  He was voiding without difficulty.  No dysuria or gross hematuria.  No fevers or chills.  He has a prior history of kidney stones.  He has not required any intervention for stones.  Labs from 01/11/2022: H&H 13.9/42.6 Creatinine 1.53  CT abdomen with and without contrast from 01/12/2022 demonstrated no substantial change in the right perinephric hematoma, mass effect on the right kidney resulting in delayed enhancement and contrast excretion, no definite mass lesion within the right kidney, tiny 9 mm hypoattenuating lesion toward the lower pole, indeterminate, bilateral nonobstructing nephrolithiasis, stable left renal  cyst.  At his visit on 01/16/22, his right-sided flank pain had improved.  He was no longer taking pain medication and was only using Tylenol.  He continued to have some nausea but is tolerating p.o. liquids and solid foods well.  No dysuria or gross hematuria.  No fevers or chills.  Labs from 01/26/2022: Creatinine 1.62 H&H  13.7/42.1  Labs from 02/01/2022: Creatinine 1.57 H&H  13.3/39.6  Renal ultrasound from 02/07/2022 showed a stable subcapsular hematoma measuring approximately 6 x 10 x 5 cm, no hydronephrosis and small bilateral renal cyst.  He returns today for scheduled follow-up.  His right-sided flank pain has almost completely resolved.  He is no longer having any nausea.  He is voiding without difficulty.  No dysuria or gross hematuria.   Portions of the above documentation were copied from a prior visit for review purposes only.   Past Medical History:  Past Medical History:  Diagnosis Date   Cataract    no surgery as yet/pre diagnosed   Diabetes mellitus without complication ([place])    Hypertension    Kidney stone    Pituitary adenoma ([place])    Sleep apnea    Stroke ([place])    Thyroid nodule     Past Surgical History:  Past Surgical History:  Procedure Laterality Date   bilateral inguinal hernia repair     HERNIA REPAIR      Allergies:  No Known Allergies  Family History:  Family History  Problem Relation Age of Onset   Colon cancer Neg Hx    Colon polyps Neg Hx    Esophageal cancer Neg Hx  Rectal cancer Neg Hx    Stomach cancer Neg Hx     Social History:  Social History   Tobacco Use   Smoking status: Never   Smokeless tobacco: Never  Vaping Use   Vaping Use: Never used  Substance Use Topics   Alcohol use: No   Drug use: No    ROS: Constitutional:  Negative for fever, chills, weight loss CV: Negative for chest pain, previous MI, hypertension Respiratory:  Negative for shortness of breath, wheezing, sleep apnea, frequent cough GI:  Negative  for nausea, vomiting, bloody stool, GERD  Physical exam: BP (!) 165/82   Pulse 67  GENERAL APPEARANCE:  Well appearing, well developed, well nourished, NAD HEENT:  Atraumatic, normocephalic, oropharynx clear NECK:  Supple without lymphadenopathy or thyromegaly ABDOMEN:  Soft, non-tender, no masses EXTREMITIES:  Moves all extremities well, without clubbing, cyanosis, or edema NEUROLOGIC:  Alert and oriented x 3, normal gait, CN II-XII grossly intact MENTAL STATUS:  appropriate BACK:  Non-tender to palpation, No CVAT SKIN:  Warm, dry, and intact  Results: U/A: dipstick negative

## 2022-03-01 ENCOUNTER — Ambulatory Visit
Admission: RE | Admit: 2022-03-01 | Discharge: 2022-03-01 | Disposition: A | Discharge status: Home / Self Care | Payer: BC Managed Care – PPO | Source: Ambulatory Visit | Attending: Neurosurgery | Admitting: Neurosurgery

## 2022-03-01 DIAGNOSIS — D497 Neoplasm of unspecified behavior of endocrine glands and other parts of nervous system: Secondary | ICD-10-CM

## 2022-03-01 MED ORDER — GADOBENATE DIMEGLUMINE 529 MG/ML IV SOLN
9.0000 mL | Freq: Once | INTRAVENOUS | Status: AC | PRN
  Administered 2022-03-01: 9 mL via INTRAVENOUS

## 2022-03-27 ENCOUNTER — Ambulatory Visit (HOSPITAL_COMMUNITY)
Admission: RE | Admit: 2022-03-27 | Discharge: 2022-03-27 | Disposition: A | Discharge status: Home / Self Care | Payer: BC Managed Care – PPO | Source: Ambulatory Visit | Attending: Urology | Admitting: Urology

## 2022-03-27 DIAGNOSIS — S37019A Minor contusion of unspecified kidney, initial encounter: Secondary | ICD-10-CM | POA: Diagnosis present

## 2022-04-05 ENCOUNTER — Encounter: Payer: Self-pay | Admitting: Urology

## 2022-04-05 ENCOUNTER — Ambulatory Visit: Payer: BC Managed Care – PPO | Admitting: Urology

## 2022-04-05 VITALS — BP 143/75 | HR 57 | Ht 68.0 in | Wt 190.0 lb

## 2022-04-05 DIAGNOSIS — S37019A Minor contusion of unspecified kidney, initial encounter: Secondary | ICD-10-CM

## 2022-04-05 NOTE — Progress Notes (Signed)
Assessment: 1. Perinephric hematoma; spontaneous; RIGHT    Plan: Schedule for MRI abdomen with and without in 1 month Return to office after MRI done  Chief Complaint:  Chief Complaint  Patient presents with   Renal hematoma   History of Present Illness:  Jaime Thompson is a 56 y.o. year old male who is seen for further evaluation of right perinephric hematoma.  He presented to the emergency room on 01/11/22 with acute onset of right-sided flank pain.  The flank pain began several hours prior to presentation.  He reported as sharp and stabbing and severe in intensity.  He had associated nausea and vomiting.  Evaluation with CT renal stone study showed no renal or ureteral calculi and no evidence of obstruction, a 3.3 cm partially calcified cyst upper left kidney and a 6.7 x 1.4 x 7.9 cm ill-defined crescentic shaped area of heterogeneous mildly increased attenuation within the subcapsular region of the right kidney consistent with a subcapsular hematoma. He was hemodynamically stable. Labs: H&H 14.7/43.1 Creatinine 1.21 U/A >50 RBCs  No history of trauma or anticoagulation. His pain was well controlled with oral pain medication.  He reported his pain was a 3-4/10.  He continued to have some nausea but no vomiting.  He was voiding without difficulty.  No dysuria or gross hematuria.  No fevers or chills.  He has a prior history of kidney stones.  He has not required any intervention for stones.  Labs from 01/11/2022: H&H 13.9/42.6 Creatinine 1.53  CT abdomen with and without contrast from 01/12/2022 demonstrated no substantial change in the right perinephric hematoma, mass effect on the right kidney resulting in delayed enhancement and contrast excretion, no definite mass lesion within the right kidney, tiny 9 mm hypoattenuating lesion toward the lower pole, indeterminate, bilateral nonobstructing nephrolithiasis, stable left renal cyst.  At his visit on 01/16/22, his right-sided flank  pain had improved.  He was no longer taking pain medication and was only using Tylenol.  He continued to have some nausea but is tolerating p.o. liquids and solid foods well.  No dysuria or gross hematuria.  No fevers or chills.  Labs from 01/26/2022: Creatinine 1.62 H&H  13.7/42.1  Labs from 02/01/2022: Creatinine 1.57 H&H  13.3/39.6  Renal ultrasound from 02/07/2022 showed a stable subcapsular hematoma measuring approximately 6 x 10 x 5 cm, no hydronephrosis and small bilateral renal cyst. Renal ultrasound from 03/28/2022 shows a thin hypoechoic crescent shaped subcapsular hematoma on the right measuring 1.1 cm.  No hydronephrosis noted.  He returns today for follow-up.  He is not having any significant right-sided flank pain.  He has noted an occasional twinge in the right flank.  No dysuria or gross hematuria.  Portions of the above documentation were copied from a prior visit for review purposes only.   Past Medical History:  Past Medical History:  Diagnosis Date   Cataract    no surgery as yet/pre diagnosed   Diabetes mellitus without complication ([place])    Hypertension    Kidney stone    Pituitary adenoma ([place])    Sleep apnea    Stroke ([place])    Thyroid nodule     Past Surgical History:  Past Surgical History:  Procedure Laterality Date   bilateral inguinal hernia repair     HERNIA REPAIR      Allergies:  No Known Allergies  Family History:  Family History  Problem Relation Age of Onset   Colon cancer Neg Hx    Colon polyps  Neg Hx    Esophageal cancer Neg Hx    Rectal cancer Neg Hx    Stomach cancer Neg Hx     Social History:  Social History   Tobacco Use   Smoking status: Never   Smokeless tobacco: Never  Vaping Use   Vaping Use: Never used  Substance Use Topics   Alcohol use: No   Drug use: No    ROS: Constitutional:  Negative for fever, chills, weight loss CV: Negative for chest pain, previous MI, hypertension Respiratory:  Negative for shortness  of breath, wheezing, sleep apnea, frequent cough GI:  Negative for nausea, vomiting, bloody stool, GERD  Physical exam: BP (!) 143/75   Pulse (!) 57   Ht '5\' 8"'$  (1.727 m)   Wt 190 lb (86.2 kg)   BMI 28.89 kg/m  GENERAL APPEARANCE:  Well appearing, well developed, well nourished, NAD HEENT:  Atraumatic, normocephalic, oropharynx clear NECK:  Supple without lymphadenopathy or thyromegaly ABDOMEN:  Soft, non-tender, no masses EXTREMITIES:  Moves all extremities well, without clubbing, cyanosis, or edema NEUROLOGIC:  Alert and oriented x 3, normal gait, CN II-XII grossly intact MENTAL STATUS:  appropriate BACK:  Non-tender to palpation, No CVAT SKIN:  Warm, dry, and intact  Results: U/A: dipstick negative

## 2022-04-06 LAB — URINALYSIS, ROUTINE W REFLEX MICROSCOPIC
Bilirubin, UA: NEGATIVE
Ketones, UA: NEGATIVE
Leukocytes,UA: NEGATIVE
Nitrite, UA: NEGATIVE
Protein,UA: NEGATIVE
RBC, UA: NEGATIVE
Specific Gravity, UA: 1.01 (ref 1.005–1.030)
Urobilinogen, Ur: 0.2 mg/dL (ref 0.2–1.0)
pH, UA: 5 (ref 5.0–7.5)

## 2022-04-25 ENCOUNTER — Ambulatory Visit (HOSPITAL_COMMUNITY)
Admission: RE | Admit: 2022-04-25 | Discharge: 2022-04-25 | Disposition: A | Discharge status: Home / Self Care | Payer: BC Managed Care – PPO | Source: Ambulatory Visit | Attending: Urology | Admitting: Urology

## 2022-04-25 DIAGNOSIS — S37019A Minor contusion of unspecified kidney, initial encounter: Secondary | ICD-10-CM | POA: Diagnosis present

## 2022-04-25 MED ORDER — GADOBUTROL 1 MMOL/ML IV SOLN
9.0000 mL | Freq: Once | INTRAVENOUS | Status: AC | PRN
Start: 2022-04-25 — End: 2022-04-25
  Administered 2022-04-25: 9 mL via INTRAVENOUS

## 2022-05-01 ENCOUNTER — Encounter: Payer: Self-pay | Admitting: Urology

## 2022-05-10 ENCOUNTER — Ambulatory Visit: Payer: BC Managed Care – PPO | Admitting: Urology

## 2022-05-10 ENCOUNTER — Encounter: Payer: Self-pay | Admitting: Urology

## 2022-05-10 VITALS — BP 155/82 | HR 56

## 2022-05-10 DIAGNOSIS — N281 Cyst of kidney, acquired: Secondary | ICD-10-CM

## 2022-05-10 DIAGNOSIS — S37019A Minor contusion of unspecified kidney, initial encounter: Secondary | ICD-10-CM

## 2022-05-10 LAB — URINALYSIS, ROUTINE W REFLEX MICROSCOPIC
Bilirubin, UA: NEGATIVE
Ketones, UA: NEGATIVE
Leukocytes,UA: NEGATIVE
Nitrite, UA: NEGATIVE
Protein,UA: NEGATIVE
Specific Gravity, UA: 1.01 (ref 1.005–1.030)
Urobilinogen, Ur: 0.2 mg/dL (ref 0.2–1.0)
pH, UA: 6 (ref 5.0–7.5)

## 2022-05-10 LAB — MICROSCOPIC EXAMINATION
Renal Epithel, UA: NONE SEEN /hpf
WBC, UA: NONE SEEN /hpf (ref 0–5)

## 2022-05-10 NOTE — Progress Notes (Signed)
Assessment: 1. Perinephric hematoma; spontaneous; RIGHT     Plan: I personally reviewed the MRI study from 04/27/2022 with results as noted below. I discussed these results in detail with the patient and his wife.  Fortunately, there is no evidence of an underlying renal mass that would explain the spontaneous perinephric hematoma.  He does have bilateral renal cyst without any suspicious characteristics.  The exact etiology for the spontaneous perinephric hematoma is uncertain.  Clinically, he is doing very well. BMP today Return to office prn  Chief Complaint:  Chief Complaint  Patient presents with   Renal hematoma   History of Present Illness:  Jaime Thompson is a 56 y.o. year old male who is seen for further evaluation of right perinephric hematoma.  He presented to the emergency room on 01/11/22 with acute onset of right-sided flank pain.  The flank pain began several hours prior to presentation.  He reported as sharp and stabbing and severe in intensity.  He had associated nausea and vomiting.  Evaluation with CT renal stone study showed no renal or ureteral calculi and no evidence of obstruction, a 3.3 cm partially calcified cyst upper left kidney and a 6.7 x 1.4 x 7.9 cm ill-defined crescentic shaped area of heterogeneous mildly increased attenuation within the subcapsular region of the right kidney consistent with a subcapsular hematoma. He was hemodynamically stable. Labs: H&H 14.7/43.1 Creatinine 1.21 U/A >50 RBCs  No history of trauma or anticoagulation. His pain was well controlled with oral pain medication.  He continued to have some nausea but no vomiting.  He was voiding without difficulty.  No dysuria or gross hematuria.  No fevers or chills.  He has a prior history of kidney stones.  He has not required any intervention for stones.  Labs from 01/11/2022: H&H 13.9/42.6 Creatinine 1.53  CT abdomen with and without contrast from 01/12/2022 demonstrated no substantial  change in the right perinephric hematoma, mass effect on the right kidney resulting in delayed enhancement and contrast excretion, no definite mass lesion within the right kidney, tiny 9 mm hypoattenuating lesion toward the lower pole, indeterminate, bilateral nonobstructing nephrolithiasis, stable left renal cyst.  At his visit on 01/16/22, his right-sided flank pain had improved.  He was no longer taking pain medication and was only using Tylenol.  He continued to have some nausea but is tolerating p.o. liquids and solid foods well.  No dysuria or gross hematuria.  No fevers or chills.  Labs from 01/26/2022: Creatinine 1.62 H&H  13.7/42.1  Labs from 02/01/2022: Creatinine 1.57 H&H  13.3/39.6  Renal ultrasound from 02/07/2022 showed a stable subcapsular hematoma measuring approximately 6 x 10 x 5 cm, no hydronephrosis and small bilateral renal cyst. Renal ultrasound from 03/28/2022 shows a thin hypoechoic crescent shaped subcapsular hematoma on the right measuring 1.1 cm.  No hydronephrosis noted. MRI from 04/27/2022 showed a small right renal capsular hematoma significantly decreased in size, a subcentimeter Bosniak 2 hemorrhagic cyst in lower pole of the right kidney, 2 Bosniak 2 hemorrhagic cyst in the left kidney stable in size.  He returns today for follow-up.  He is not having any flank pain.  No dysuria or gross hematuria. IPSS = 1 today.  Portions of the above documentation were copied from a prior visit for review purposes only.   Past Medical History:  Past Medical History:  Diagnosis Date   Cataract    no surgery as yet/pre diagnosed   Diabetes mellitus without complication ([place])    Hypertension  Kidney stone    Pituitary adenoma ([facility])    Sleep apnea    Stroke [facility])    Thyroid nodule     Past Surgical History:  Past Surgical History:  Procedure Laterality Date   bilateral inguinal hernia repair     HERNIA REPAIR      Allergies:  No Known Allergies  Family History:   Family History  Problem Relation Age of Onset   Colon cancer Neg Hx    Colon polyps Neg Hx    Esophageal cancer Neg Hx    Rectal cancer Neg Hx    Stomach cancer Neg Hx     Social History:  Social History   Tobacco Use   Smoking status: Never   Smokeless tobacco: Never  Vaping Use   Vaping Use: Never used  Substance Use Topics   Alcohol use: No   Drug use: No    ROS: Constitutional:  Negative for fever, chills, weight loss CV: Negative for chest pain, previous MI, hypertension Respiratory:  Negative for shortness of breath, wheezing, sleep apnea, frequent cough GI:  Negative for nausea, vomiting, bloody stool, GERD  Physical exam: BP (!) 155/82   Pulse (!) 56  GENERAL APPEARANCE:  Well appearing, well developed, well nourished, NAD HEENT:  Atraumatic, normocephalic, oropharynx clear NECK:  Supple without lymphadenopathy or thyromegaly ABDOMEN:  Soft, non-tender, no masses EXTREMITIES:  Moves all extremities well, without clubbing, cyanosis, or edema NEUROLOGIC:  Alert and oriented x 3, normal gait, CN II-XII grossly intact MENTAL STATUS:  appropriate BACK:  Non-tender to palpation, No CVAT SKIN:  Warm, dry, and intact  Results: U/A: 0-2 RBC, few bacteria

## 2022-05-11 LAB — BASIC METABOLIC PANEL
BUN/Creatinine Ratio: 11 (ref 9–20)
BUN: 16 mg/dL (ref 6–24)
CO2: 19 mmol/L — ABNORMAL LOW (ref 20–29)
Calcium: 9.5 mg/dL (ref 8.7–10.2)
Chloride: 104 mmol/L (ref 96–106)
Creatinine, Ser: 1.43 mg/dL — ABNORMAL HIGH (ref 0.76–1.27)
Glucose: 179 mg/dL — ABNORMAL HIGH (ref 70–99)
Potassium: 4.7 mmol/L (ref 3.5–5.2)
Sodium: 142 mmol/L (ref 134–144)
eGFR: 58 mL/min/{1.73_m2} — ABNORMAL LOW (ref >59)

## 2022-05-18 ENCOUNTER — Telehealth: Payer: Self-pay

## 2022-05-18 NOTE — Telephone Encounter (Signed)
Unable to reach patient by phone after multiple attempts.  Letter sent informing patient of results.

## 2022-05-18 NOTE — Telephone Encounter (Signed)
-----   Message from Primus Bravo, MD sent at 05/11/2022 11:00 AM EDT ----- Please let the patient know that his renal function has improved but remains slightly elevated.  I would recommend continued monitoring.  His primary care physician can recheck this at his visit in December 2023.

## 2022-08-22 ENCOUNTER — Other Ambulatory Visit: Payer: Self-pay | Admitting: Neurosurgery

## 2022-08-22 DIAGNOSIS — D497 Neoplasm of unspecified behavior of endocrine glands and other parts of nervous system: Secondary | ICD-10-CM

## 2022-09-04 ENCOUNTER — Encounter: Payer: Self-pay | Admitting: Neurosurgery

## 2022-09-05 ENCOUNTER — Ambulatory Visit
Admission: RE | Admit: 2022-09-05 | Discharge: 2022-09-05 | Disposition: A | Discharge status: Home / Self Care | Payer: BC Managed Care – PPO | Source: Ambulatory Visit | Attending: Neurosurgery | Admitting: Neurosurgery

## 2022-09-05 DIAGNOSIS — D497 Neoplasm of unspecified behavior of endocrine glands and other parts of nervous system: Secondary | ICD-10-CM

## 2022-09-05 MED ORDER — GADOPICLENOL 0.5 MMOL/ML IV SOLN
5.0000 mL | Freq: Once | INTRAVENOUS | Status: AC | PRN
  Administered 2022-09-05: 5 mL via INTRAVENOUS

## 2022-09-09 ENCOUNTER — Other Ambulatory Visit: Payer: BC Managed Care – PPO

## 2023-08-16 ENCOUNTER — Other Ambulatory Visit: Payer: Self-pay | Admitting: Neurosurgery

## 2023-08-16 DIAGNOSIS — D497 Neoplasm of unspecified behavior of endocrine glands and other parts of nervous system: Secondary | ICD-10-CM

## 2023-08-21 ENCOUNTER — Encounter: Payer: Self-pay | Admitting: Neurosurgery

## 2023-09-06 ENCOUNTER — Ambulatory Visit
Admission: RE | Admit: 2023-09-06 | Discharge: 2023-09-06 | Disposition: A | Discharge status: Home / Self Care | Payer: BC Managed Care – PPO | Source: Ambulatory Visit | Attending: Neurosurgery | Admitting: Neurosurgery

## 2023-09-06 DIAGNOSIS — D497 Neoplasm of unspecified behavior of endocrine glands and other parts of nervous system: Secondary | ICD-10-CM

## 2023-09-06 MED ORDER — GADOPICLENOL 0.5 MMOL/ML IV SOLN
9.0000 mL | Freq: Once | INTRAVENOUS | Status: AC | PRN
Start: 2023-09-06 — End: 2023-09-06
  Administered 2023-09-06: 9 mL via INTRAVENOUS

## 2024-09-22 ENCOUNTER — Other Ambulatory Visit: Payer: Self-pay | Admitting: Neurosurgery

## 2024-09-22 DIAGNOSIS — D497 Neoplasm of unspecified behavior of endocrine glands and other parts of nervous system: Secondary | ICD-10-CM

## 2024-09-23 ENCOUNTER — Encounter: Payer: Self-pay | Admitting: Neurosurgery
